# Patient Record
Sex: Male | Born: 1935 | Race: White | Hispanic: No | Marital: Married
Health system: Southern US, Community
[De-identification: ages and names within clinical notes are randomized; demographics above are authoritative.]

---

## 2007-10-18 ENCOUNTER — Encounter: Admission: RE | Admit: 2007-10-18 | Discharge: 2007-10-18 | Payer: Self-pay | Admitting: Orthopedic Surgery

## 2007-10-30 ENCOUNTER — Encounter: Admission: RE | Admit: 2007-10-30 | Discharge: 2007-10-30 | Payer: Self-pay | Admitting: Orthopedic Surgery

## 2008-08-06 ENCOUNTER — Encounter: Admission: RE | Admit: 2008-08-06 | Discharge: 2008-08-06 | Payer: Self-pay | Admitting: Orthopedic Surgery

## 2010-03-08 ENCOUNTER — Encounter: Payer: Self-pay | Admitting: Orthopedic Surgery

## 2012-04-29 ENCOUNTER — Inpatient Hospital Stay: Payer: Self-pay | Admitting: Student

## 2012-04-29 LAB — URINALYSIS, COMPLETE
Bacteria: NONE SEEN
Bilirubin,UR: NEGATIVE
Blood: NEGATIVE
Glucose,UR: NEGATIVE mg/dL (ref 0–75)
Hyaline Cast: 3
Ketone: NEGATIVE
Leukocyte Esterase: NEGATIVE
Nitrite: NEGATIVE
Ph: 9 (ref 4.5–8.0)
Protein: NEGATIVE
RBC,UR: 1 /HPF (ref 0–5)
Specific Gravity: 1.011 (ref 1.003–1.030)
Squamous Epithelial: NONE SEEN
WBC UR: 1 /HPF (ref 0–5)

## 2012-04-29 LAB — COMPREHENSIVE METABOLIC PANEL WITH GFR
Albumin: 3.5 g/dL (ref 3.4–5.0)
Alkaline Phosphatase: 55 U/L (ref 50–136)
Anion Gap: 6 — ABNORMAL LOW (ref 7–16)
BUN: 24 mg/dL — ABNORMAL HIGH (ref 7–18)
Bilirubin,Total: 0.6 mg/dL (ref 0.2–1.0)
Calcium, Total: 8.3 mg/dL — ABNORMAL LOW (ref 8.5–10.1)
Chloride: 100 mmol/L (ref 98–107)
Co2: 30 mmol/L (ref 21–32)
Creatinine: 1.58 mg/dL — ABNORMAL HIGH (ref 0.60–1.30)
EGFR (African American): 43 — ABNORMAL LOW
EGFR (Non-African Amer.): 37 — ABNORMAL LOW
Glucose: 98 mg/dL (ref 65–99)
Osmolality: 276 (ref 275–301)
Potassium: 3.1 mmol/L — ABNORMAL LOW (ref 3.5–5.1)
SGOT(AST): 20 U/L (ref 15–37)
SGPT (ALT): 18 U/L (ref 12–78)
Sodium: 136 mmol/L (ref 136–145)
Total Protein: 7 g/dL (ref 6.4–8.2)

## 2012-04-29 LAB — CBC
MCH: 34 pg (ref 26.0–34.0)
MCHC: 34.2 g/dL (ref 32.0–36.0)
MCV: 99 fL (ref 80–100)
RBC: 3.91 10*6/uL — ABNORMAL LOW (ref 4.40–5.90)

## 2012-04-29 LAB — MAGNESIUM: Magnesium: 1.9 mg/dL

## 2012-04-29 LAB — CK TOTAL AND CKMB (NOT AT ARMC)
CK, Total: 73 U/L (ref 35–232)
CK-MB: 0.7 ng/mL (ref 0.5–3.6)

## 2012-04-29 LAB — TROPONIN I: Troponin-I: <0.02 ng/mL

## 2012-04-30 LAB — LIPID PANEL
Triglycerides: 176 mg/dL (ref 0–200)
VLDL Cholesterol, Calc: 35 mg/dL (ref 5–40)

## 2012-05-01 LAB — BASIC METABOLIC PANEL
Anion Gap: 8 (ref 7–16)
BUN: 26 mg/dL — ABNORMAL HIGH (ref 7–18)
Calcium, Total: 8.7 mg/dL (ref 8.5–10.1)
Chloride: 103 mmol/L (ref 98–107)
Co2: 28 mmol/L (ref 21–32)
Creatinine: 1.34 mg/dL — ABNORMAL HIGH (ref 0.60–1.30)
EGFR (African American): 52 — ABNORMAL LOW
EGFR (Non-African Amer.): 45 — ABNORMAL LOW
Glucose: 92 mg/dL (ref 65–99)
Osmolality: 282 (ref 275–301)
Potassium: 3.3 mmol/L — ABNORMAL LOW (ref 3.5–5.1)
Sodium: 139 mmol/L (ref 136–145)

## 2012-05-03 DIAGNOSIS — I10 Essential (primary) hypertension: Secondary | ICD-10-CM

## 2012-05-03 DIAGNOSIS — I6789 Other cerebrovascular disease: Secondary | ICD-10-CM

## 2012-05-03 DIAGNOSIS — F0150 Vascular dementia without behavioral disturbance: Secondary | ICD-10-CM

## 2012-05-03 DIAGNOSIS — I4891 Unspecified atrial fibrillation: Secondary | ICD-10-CM

## 2012-05-14 DIAGNOSIS — B308 Other viral conjunctivitis: Secondary | ICD-10-CM

## 2012-07-18 ENCOUNTER — Ambulatory Visit: Payer: Self-pay | Admitting: Internal Medicine

## 2013-08-10 IMAGING — US US CAROTID DUPLEX BILAT
1 series · 14 of 24 positions shown · non-contrast
Comparison: none

REASON FOR EXAM: tia. eval for stenosis
COMMENTS:

PROCEDURE:     US  - US CAROTID DOPPLER BILATERAL  - April 29, 2012 [DATE]
RESULT:     Comparison: None
TECHNIQUE: Gray-scale, color Doppler, and spectral Doppler images were
obtained of the extracranial carotid artery systems and vertebral arteries
in the neck.

[Series 1: us carotid duplex bilat · 14 of 60 slices shown]
[im 1/60]
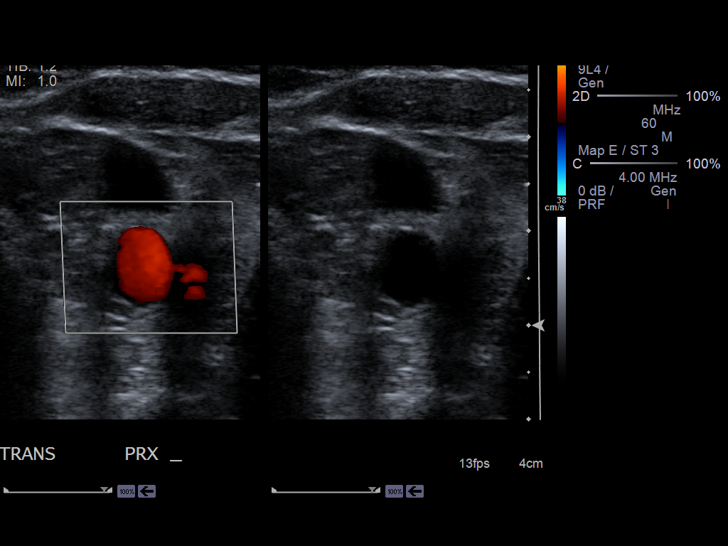
[im 6/60]
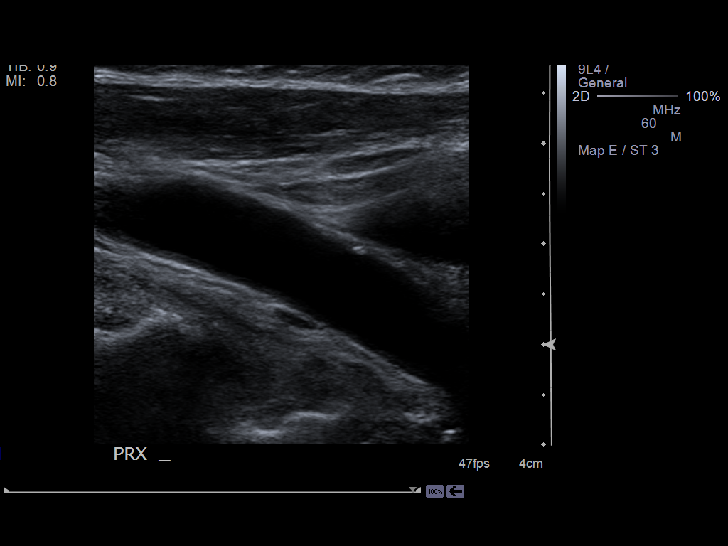
[im 11/60]
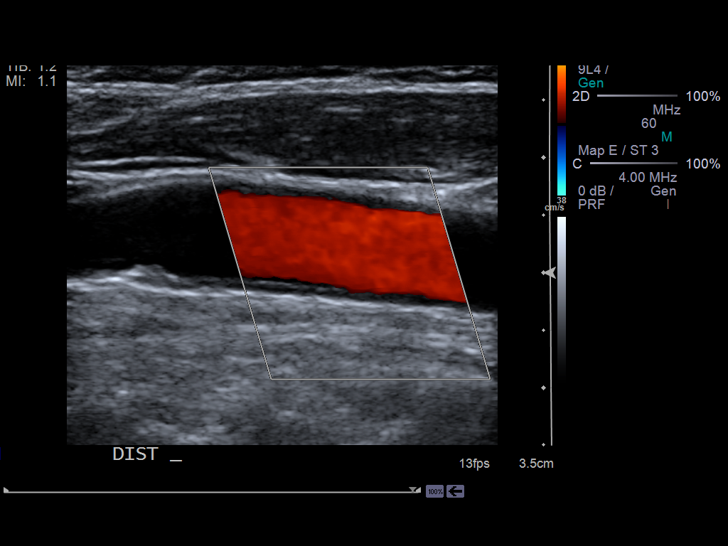
[im 16/60]
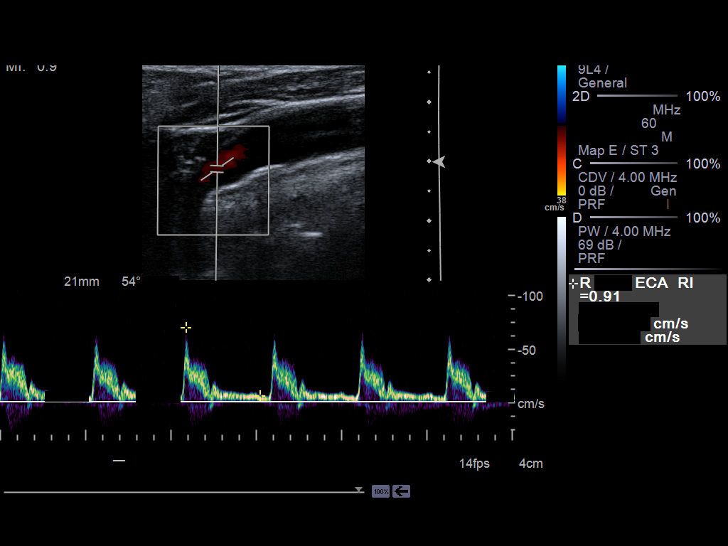
[im 18/60]
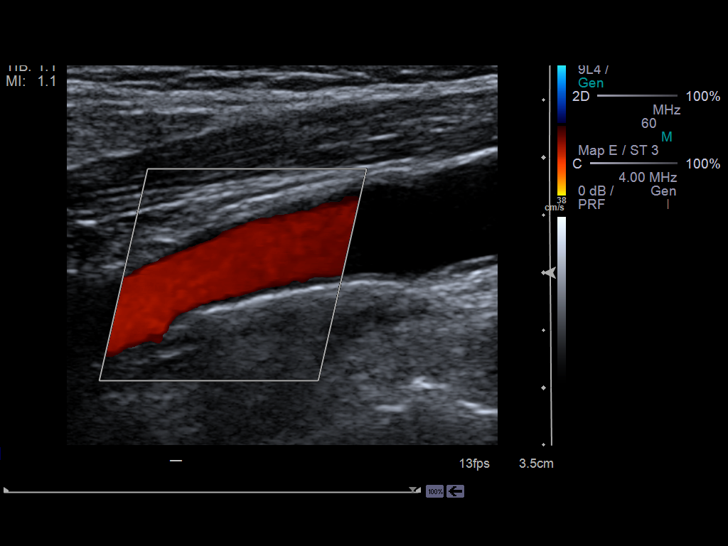
[im 24/60]
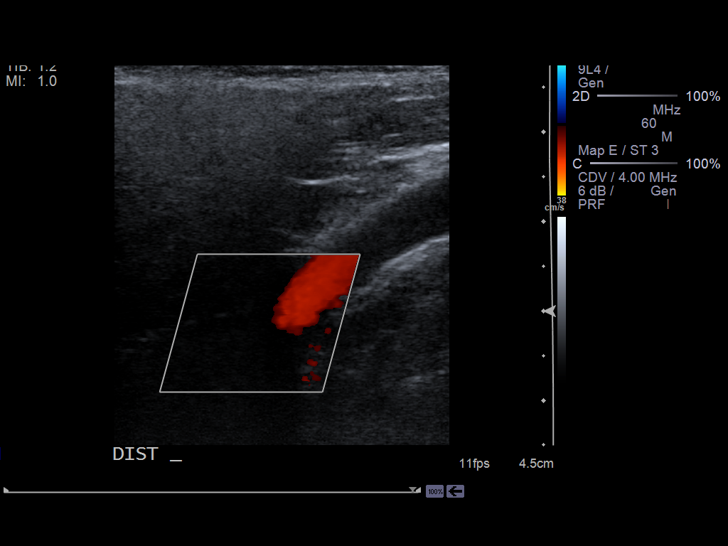
[im 29/60]
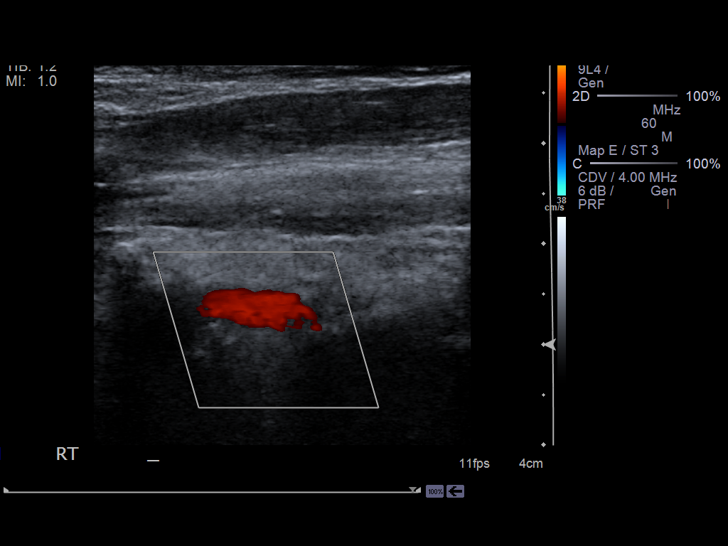
[im 31/60]
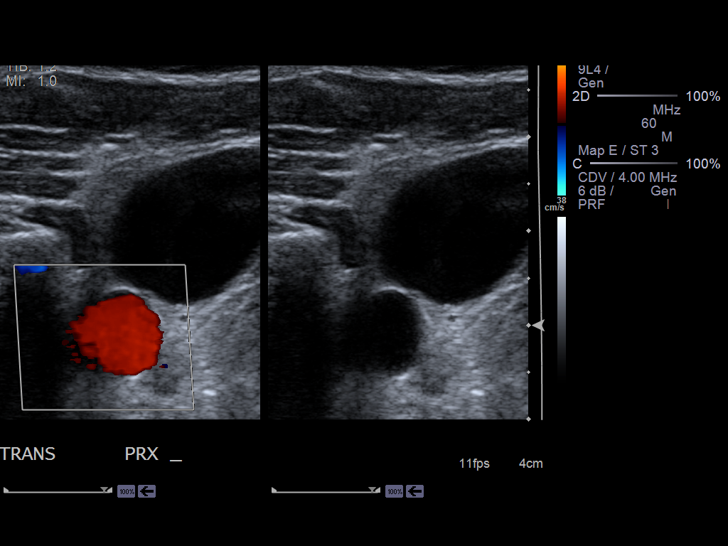
[im 36/60]
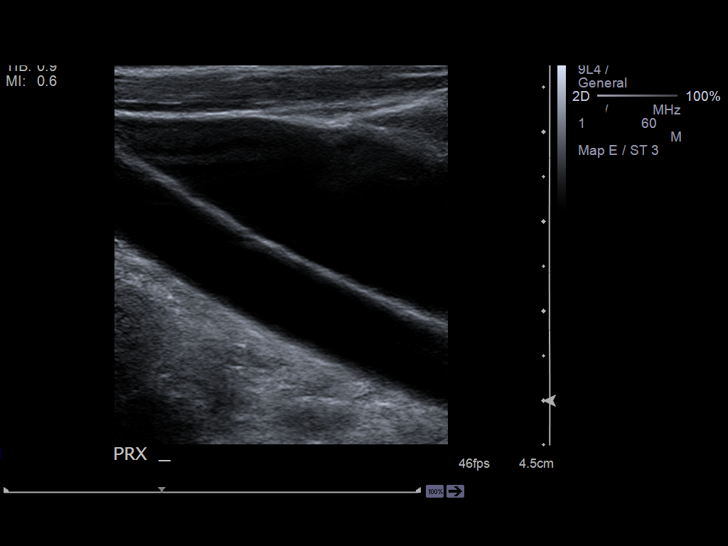
[im 42/60]
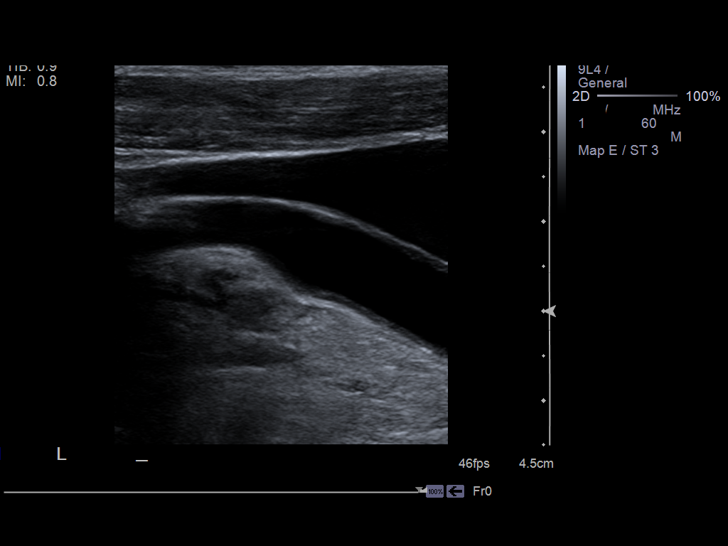
[im 47/60]
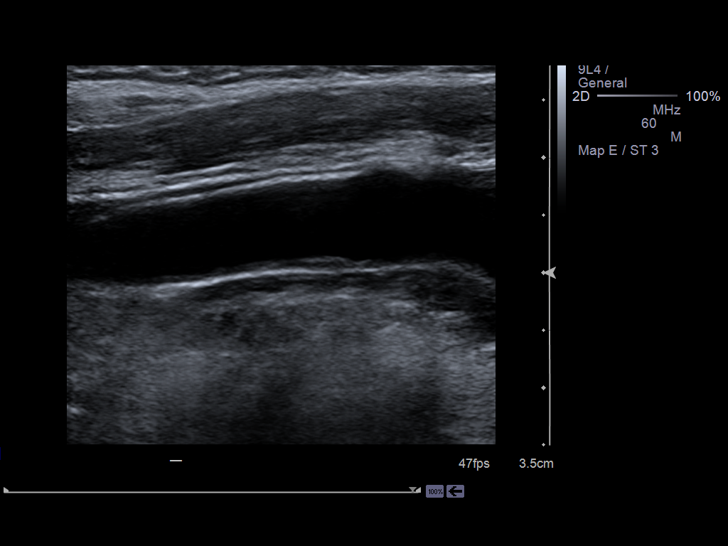
[im 49/60]
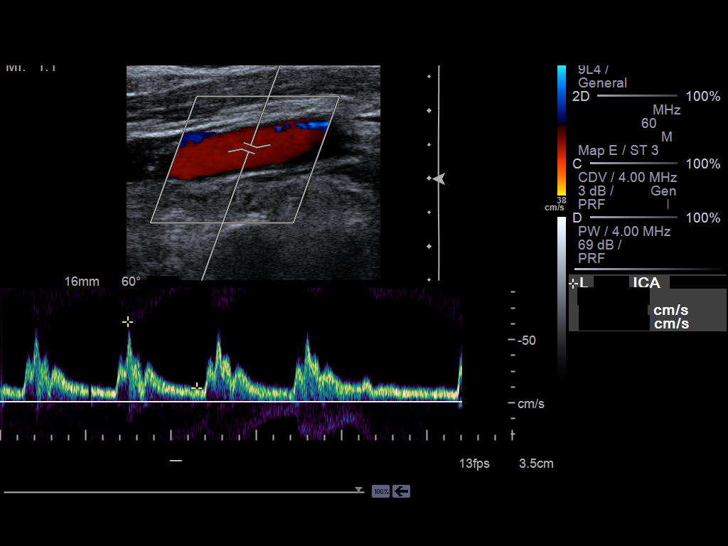
[im 54/60]
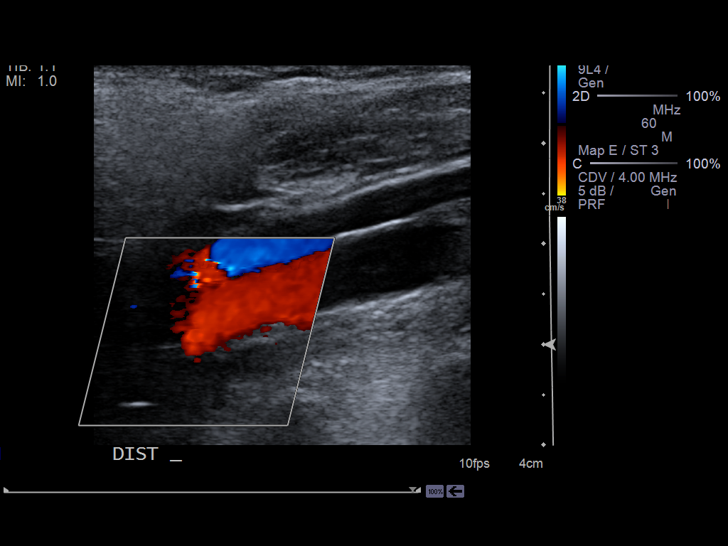
[im 60/60]
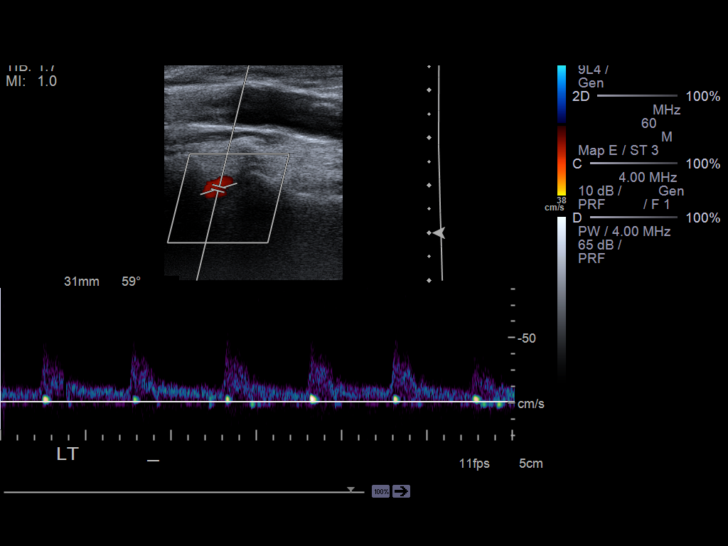

[14 of 24 positions shown; findings below may reference images not displayed]

FINDINGS: No significant atherosclerotic plaques identified in the extracranial
carotid arteries.  The peak systolic velocities are not elevated.

The vertebral arteries are patent bilaterally.
IMPRESSION: No evidence of hemodynamically significant stenosis in the extracranial
carotid arteries.

[REDACTED]

## 2013-08-10 IMAGING — CT CT HEAD WITHOUT CONTRAST
1 series · 16 of 30 positions shown, 20 images · non-contrast
Comparison: none

REASON FOR EXAM: altered mental status
COMMENTS:   May transport without cardiac monitor

PROCEDURE:     CT  - CT HEAD WITHOUT CONTRAST  - April 29, 2012  [DATE]
RESULT:     Comparison:  None
TECHNIQUE: Multiple axial images from the foramen magnum to the vertex were
obtained without IV contrast.

[Series 2: soft tissue · axial · 0.45mm/px · z∈[-167,-32]mm · 16 of 31 slices shown, 20 images]
[im 2/31  brain]
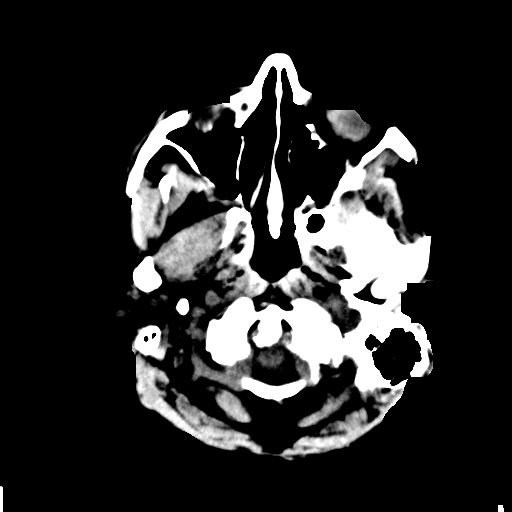
[im 2/31  bone]
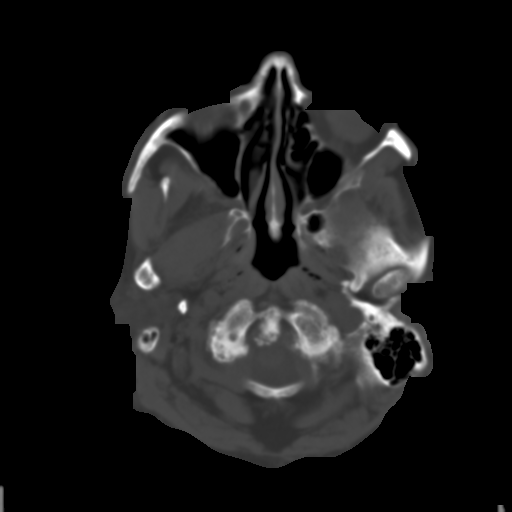
[im 4/31  brain]
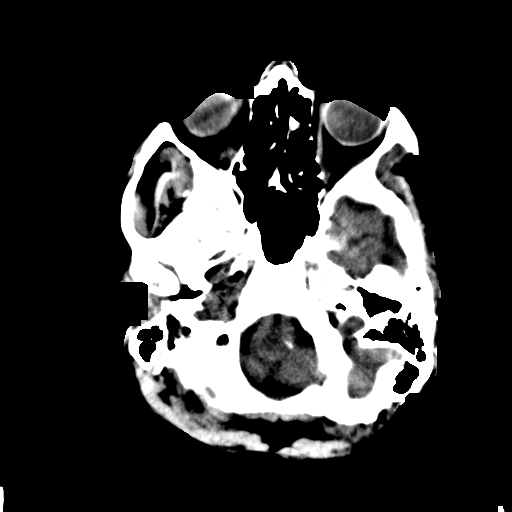
[im 6/31  brain]
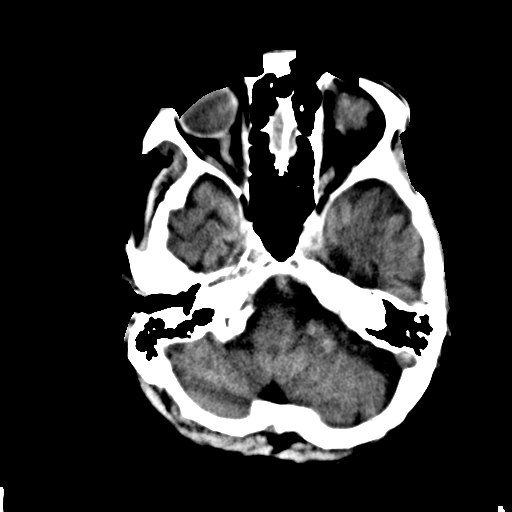
[im 8/31  brain]
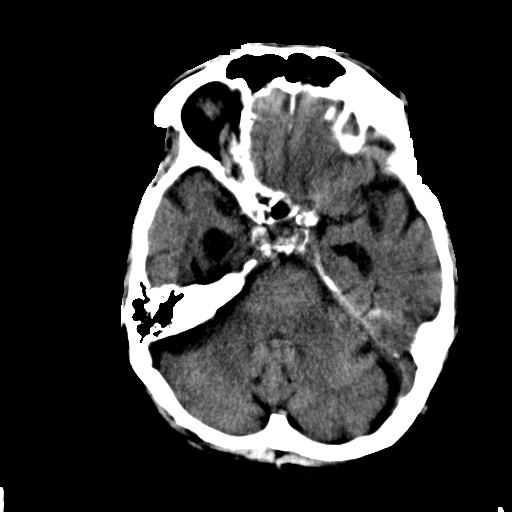
[im 9/31  brain]
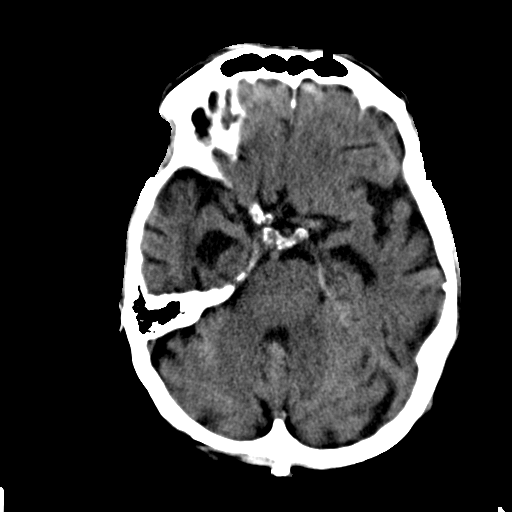
[im 9/31  bone]
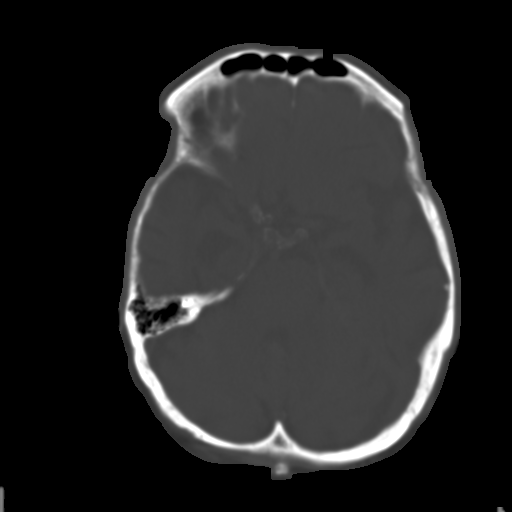
[im 11/31  brain]
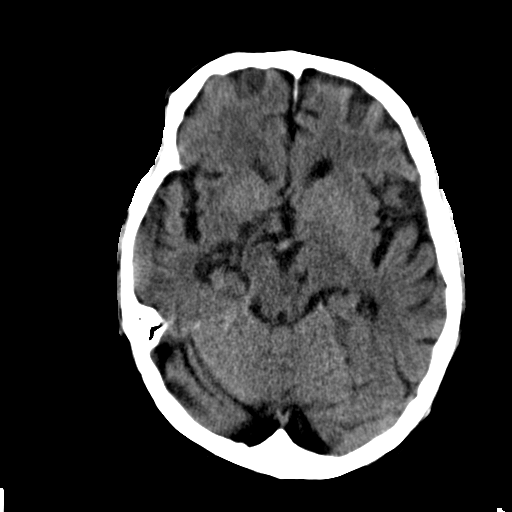
[im 13/31  brain]
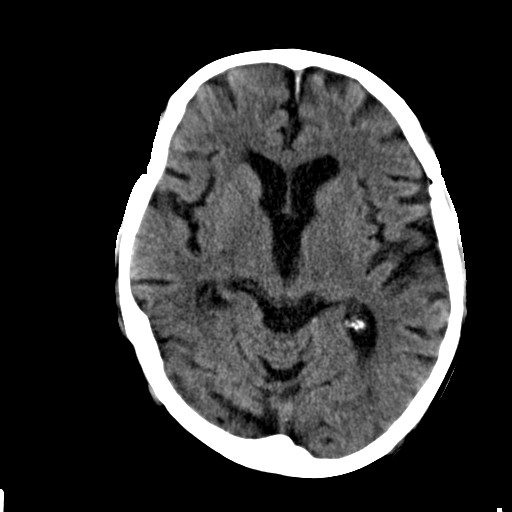
[im 15/31  brain]
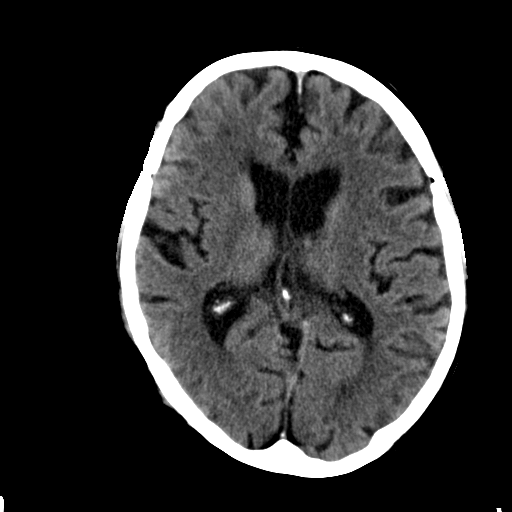
[im 16/31  brain]
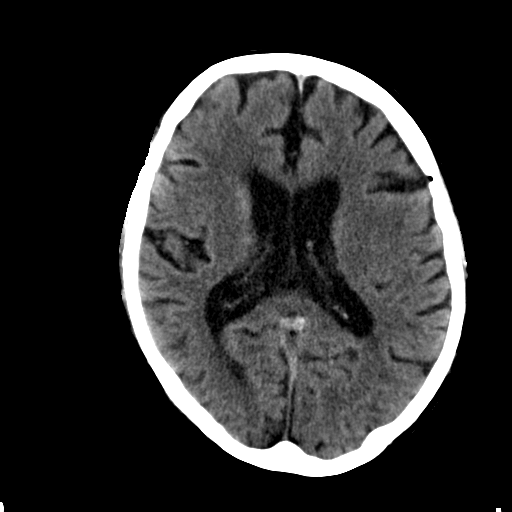
[im 16/31  bone]
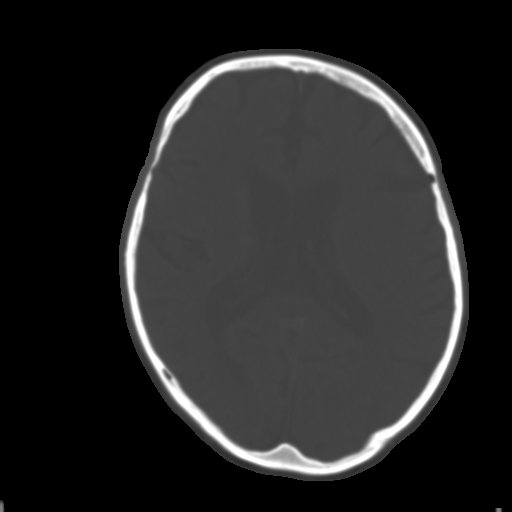
[im 18/31  brain]
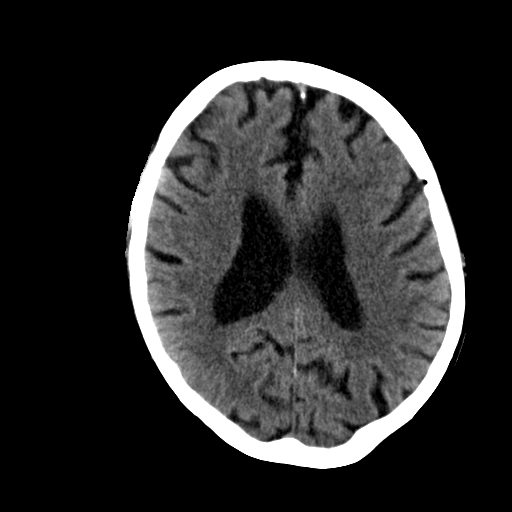
[im 20/31  brain]
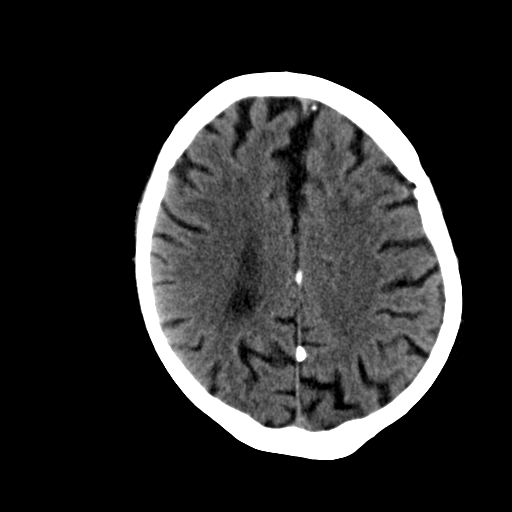
[im 22/31  brain]
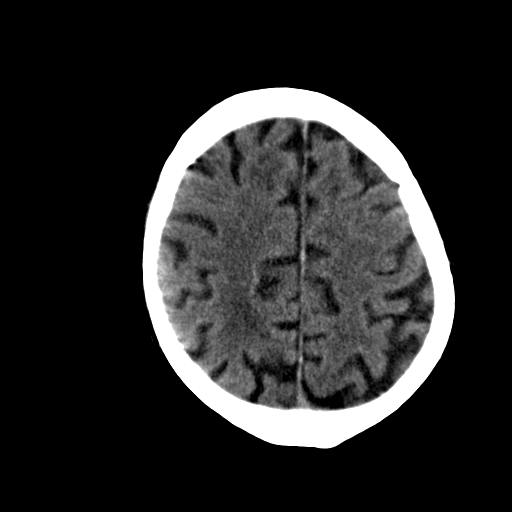
[im 23/31  brain]
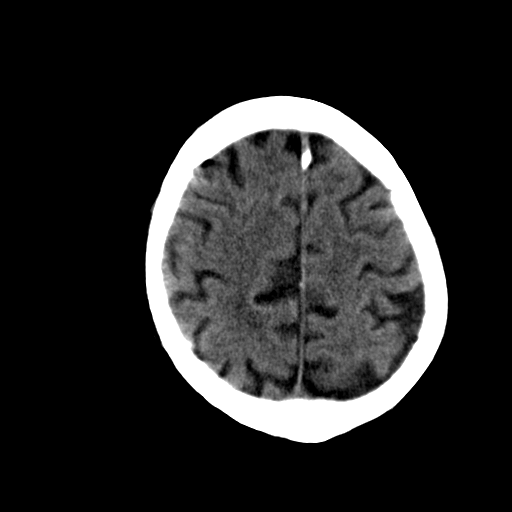
[im 23/31  bone]
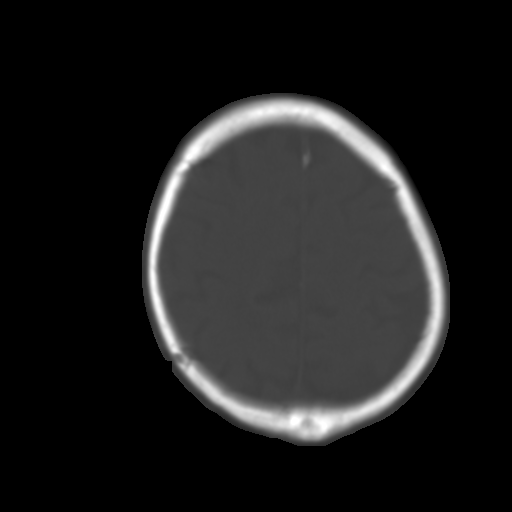
[im 25/31  brain]
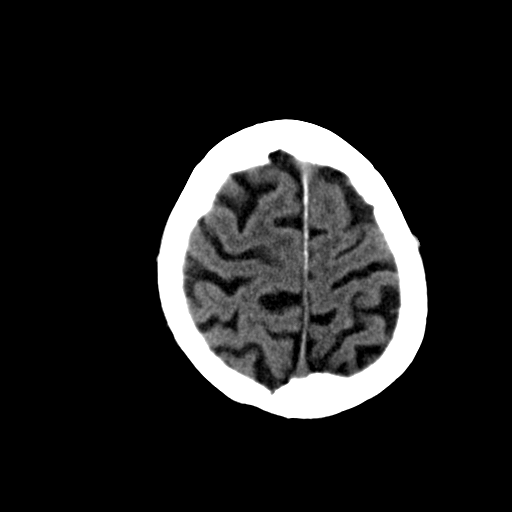
[im 27/31  brain]
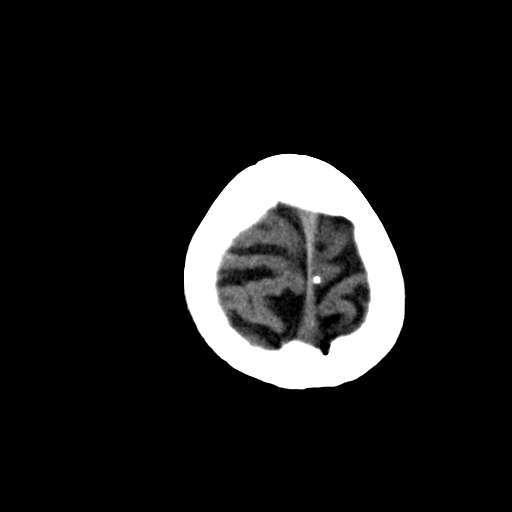
[im 29/31  brain]
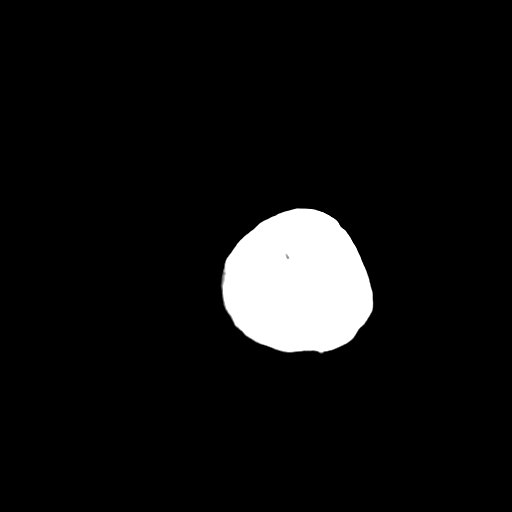

[16 of 30 positions shown; findings below may reference images not displayed]

FINDINGS: There is no evidence for mass effect, midline shift, or extra-axial fluid
collections. There is no evidence for space-occupying lesion, intracranial
hemorrhage, or cortical-based area of infarction. Mild patchy
periventricular hypoattenuation is likely sequela of chronic
microangiopathy. There is mild cerebral atrophy which is age-appropriate.
There is subtle patient motion artifact. There is a small lacunar infarct in
the left thalamus which is age-indeterminate, but likely chronic.

The osseous structures are unremarkable.
IMPRESSION: 1. No intracranial hemorrhage.
2. Small lacunar infarct in the left thalamus is age indeterminate, but
likely chronic. Further evaluation could provided with MRI, as indicated.

## 2013-12-05 DIAGNOSIS — K219 Gastro-esophageal reflux disease without esophagitis: Secondary | ICD-10-CM

## 2013-12-05 DIAGNOSIS — M069 Rheumatoid arthritis, unspecified: Secondary | ICD-10-CM

## 2013-12-05 DIAGNOSIS — I480 Paroxysmal atrial fibrillation: Secondary | ICD-10-CM

## 2013-12-05 DIAGNOSIS — F0150 Vascular dementia without behavioral disturbance: Secondary | ICD-10-CM

## 2013-12-05 DIAGNOSIS — I10 Essential (primary) hypertension: Secondary | ICD-10-CM

## 2013-12-25 DIAGNOSIS — I69398 Other sequelae of cerebral infarction: Secondary | ICD-10-CM

## 2013-12-25 DIAGNOSIS — F0151 Vascular dementia with behavioral disturbance: Secondary | ICD-10-CM

## 2013-12-25 DIAGNOSIS — K219 Gastro-esophageal reflux disease without esophagitis: Secondary | ICD-10-CM

## 2013-12-25 DIAGNOSIS — I10 Essential (primary) hypertension: Secondary | ICD-10-CM

## 2013-12-25 DIAGNOSIS — I4891 Unspecified atrial fibrillation: Secondary | ICD-10-CM

## 2013-12-25 DIAGNOSIS — M130 Polyarthritis, unspecified: Secondary | ICD-10-CM

## 2014-01-23 DIAGNOSIS — I480 Paroxysmal atrial fibrillation: Secondary | ICD-10-CM

## 2014-01-23 DIAGNOSIS — E785 Hyperlipidemia, unspecified: Secondary | ICD-10-CM

## 2014-01-23 DIAGNOSIS — F0150 Vascular dementia without behavioral disturbance: Secondary | ICD-10-CM

## 2014-01-23 DIAGNOSIS — K219 Gastro-esophageal reflux disease without esophagitis: Secondary | ICD-10-CM

## 2014-01-23 DIAGNOSIS — I10 Essential (primary) hypertension: Secondary | ICD-10-CM

## 2014-03-12 DIAGNOSIS — K219 Gastro-esophageal reflux disease without esophagitis: Secondary | ICD-10-CM

## 2014-03-12 DIAGNOSIS — I10 Essential (primary) hypertension: Secondary | ICD-10-CM

## 2014-03-12 DIAGNOSIS — E785 Hyperlipidemia, unspecified: Secondary | ICD-10-CM

## 2014-03-12 DIAGNOSIS — M064 Inflammatory polyarthropathy: Secondary | ICD-10-CM

## 2014-03-12 DIAGNOSIS — I4891 Unspecified atrial fibrillation: Secondary | ICD-10-CM

## 2014-03-12 DIAGNOSIS — F0150 Vascular dementia without behavioral disturbance: Secondary | ICD-10-CM

## 2014-04-17 DIAGNOSIS — F0150 Vascular dementia without behavioral disturbance: Secondary | ICD-10-CM

## 2014-04-17 DIAGNOSIS — I10 Essential (primary) hypertension: Secondary | ICD-10-CM | POA: Diagnosis not present

## 2014-04-17 DIAGNOSIS — I480 Paroxysmal atrial fibrillation: Secondary | ICD-10-CM | POA: Diagnosis not present

## 2014-04-17 DIAGNOSIS — K219 Gastro-esophageal reflux disease without esophagitis: Secondary | ICD-10-CM | POA: Diagnosis not present

## 2014-04-17 DIAGNOSIS — E785 Hyperlipidemia, unspecified: Secondary | ICD-10-CM | POA: Diagnosis not present

## 2014-06-06 NOTE — H&P (Signed)
PATIENT NAME:  Adam Salas, MAIORINO MR#:  [id number] DATE OF BIRTH:  01/11/1917  DATE OF ADMISSION:  04/29/2012  REFERRING PHYSICIAN: Dr. Jasmine December    PRIMARY CARE PHYSICIAN: Dr.  Lovie Macadamia   CHIEF COMPLAINT: Unable to talk.     HISTORY OF PRESENT ILLNESS: The patient is a pleasant 81 year old Caucasian male who lives independent living facility at [facility] with history of atrial fibrillation and hypertension who was here with his wife. Apparently this morning he was eating an apple about 7:30 and the wife discovered the patient's acute onset inability to verbalize speech. Furthermore, he was a little bit confused. Symptoms lasted about 30 minutes or so. EMS was called and was found with bradycardia, heart rate is in the 40s and was given 0.5 of atropine. Heart rate went to 60s. Per wife, the speech deficits are resolved. The patient has no further aphasia; however, on interviewing does have occasional ward finding disability, which per wife has been going on for the past year and a 1 to 2 years, although he has no diagnosis of dementia. EKG shows sinus rhythm. CT scan shows small lacunar infarct in the left thalamus, age indeterminate. Hospitalist services were contacted for further evaluation and management.   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   PAST SURGICAL HISTORY: Tonsillectomy, appendectomy, left hip replacement, hernia repair, cataract surgery.   FAMILY HISTORY: Hypertension.   SOCIAL HISTORY: No tobacco, drinks one beer a day, no other drug use. Lives in independent living facility at [facility]   ALLERGIES: PENICILLIN.   OUTPATIENT MEDICATIONS: Hydrochlorothiazide 12.5 mg daily, Lanoxin 125 mcg daily, Motrin 200 mg every 4 hours as needed for pain, Norvasc 5 mg daily, Prilosec 20 mg extended-release daily, propranolol 60 mg extended-release 1 tab once a day.    REVIEW OF SYSTEMS: CONSTITUTIONAL: No fever or fatigue, perhaps a couple of pounds of weight loss, which patient attributes to walking and being active.  EYES:  No blurry vision or double vision.  ENT: Some chronic hearing loss.  RESPIRATORY: No cough, wheezing. History of remote TB and pneumothorax.  CARDIOVASCULAR: No chest pain. Occasional palpitations. No swelling in the legs. History of atrial fibrillation and hypertension.  GASTROINTESTINAL: No nausea, vomiting, diarrhea, abdominal pain, rectal bleeding or dark stools or bloody stools.  GENITOURINARY: Denies dysuria or hematuria.  HEMATOLOGIC/LYMPHATIC: Anemia or easy bruising.  SKIN: No rashes.  MUSCULOSKELETAL: Has arthritis.  NEUROLOGIC: Denies numbness or weakness. No history of stroke.  PSYCHIATRIC: Denies anxiety or insomnia. Possible forgetfulness in the last year to 2 years.   PHYSICAL EXAMINATION: VITAL SIGNS: Temperature 98 on arrival, pulse 62, respiratory rate 18, blood pressure 150/69 and oxygen saturation 98% on room air.  GENERAL: The patient pleasant, elderly male with sitting in bed in no obvious distress, talking in full sentences.  HEENT: Normocephalic, atraumatic. Pupils are equal and reactive. Anicteric sclerae. Extraocular muscles intact. Moist mucous membranes.  NECK: Supple. No thyroid tenderness or cervical lymphadenopathy. No JVD.  CARDIOVASCULAR: S1 and S2 irregularly irregular. No significant murmurs or rubs appreciated.  LUNGS: Clear to auscultation without wheezing or rhonchi.  ABDOMEN: Soft, nontender, nondistended. Positive bowel sounds in all quadrants. No organomegaly appreciated.  EXTREMITIES: No significant lower extremity edema.  SKIN: No obvious rashes. There is some chronic discoloration of the lower extremities without any evidence of cellulitis.  NEUROLGOLIC:  Cranial nerves II through XII appear to be grossly  intact. Strength is 5/5 in all extremities. Sensation is intact to light touch  Romberg negative. Rapid alternating movements in finger-to-nose intact.  PSYCHIATRIC: Awake, alert, oriented x2, not oriented to  time. Cooperative, conversant.   LABORATORY, DIAGNOSTIC, AND RADIOLOGICAL DATA: CT of the head as above.   Chest x-ray, 1 view, showing dense opacities overlying the mid upper and left lung, may represent calcified pleural plaques.   EKG: Sinus rhythm with PVC. No acute ST elevations or depressions.   Glucose 98, BUN 24, creatinine 1.58, potassium 3.1. LFTs within normal limits. Magnesium 1.1. Troponin negative. CK-MB and CK total within normal limits. WBC of 6.9, hemoglobin 13.3, platelets 348. Urinalysis not suggestive of infection. No recent blood work otherwise compare to.   ASSESSMENT AND PLAN:  1.  We have a 81 year old pleasant Caucasian male with a history of hypertension, atrial fibrillation and remote history of tuberculosis with acute onset what appears to be expressive aphasia lasting about 30 minutes and resolved now, but negative CT of the head for a stroke with neurologically being intact currently. At this point, we will admit the patient to the hospital. The patient  possibly had a transient ischemic attack, but I cannot rule out a stroke. We would obtain an MRI of the brain, an echocardiogram as well as an ultrasound of carotids to look for any significant arrhythmias. Do frequent neurological checks and check a lipid profile. We would follow with the bradycardia and hold the propranolol at this point. Although he has a history of atrial fibrillation, he appears to be sinus currently. We will obtain a cardiology consult to see if the patient possibly has a sick sinus syndrome. He is not on any anticoagulation. He does not have a history of any significant falls. We would monitor him on telemetry and see if he has recurrent atrial fibrillation or sick sinus syndrome. We would start him on a full dose aspirin for now, continue the digoxin and hold the  propranolol given the bradycardia this morning.  2.  As for as the hypertension, would bump up amlodipine as we are holding the hydrochlorothiazide and a beta blocker.  3.  HCTZ secondary to renal failure and hypokalemia. We do not have a previous recent BMP and it is possible that his creatinine was baseline, which puts him in chronic kidney failure stage III, but we have to monitor the kidney function. I will start him on some gentle IV fluids for now and replete to hypokalemia. We would start him on a statin as well. Would continue with Prilosec and start him on heparin for deep vein thrombosis prophylaxis.   CODE STATUS: The patient is full code.   TOTAL TIME SPENT: 60 minutes.    ____________________________ Vivien Presto, MD sa:cc D: 04/29/2012 11:50:54 ET T: 04/29/2012 16:04:17 ET JOB#: [id number]  cc: Vivien Presto, MD, <Dictator> Youlanda Roys. Lovie Macadamia, MD Vivien Presto MD ELECTRONICALLY SIGNED 05/08/2012 13:28

## 2014-06-06 NOTE — Consult Note (Signed)
General Aspect 81 yo male followed by Dr. Lovie Macadamia with history of atrial fibrillation, palpitations, hypertension admitted after being noted to be somewhat aphasic by his wife. EMS was called and he was noted to be bradycardic with heart rates in the 40s. He was given atropine with improvement in his heart rate and per report, his aphasia improved. He has ruled out for an mi and head ct was unremarkaable for signficant acute abnormality. He had a lacunar thalamic infarct. His carotid doppler did not reveal any signficant disease. He has ruled out for an mi and ekg revaled sins rhythm on admission .  Telemetry has revealed sr with intermitant pacs and probable brief afib. He has been on digoxin and propranolol. He remains somewhat confused this am. His son states that there has been a gradual decline in his memory over the past several months. His son states he became more confused last pm during the night.   Physical Exam:  GEN well developed, well nourished, no acute distress   HEENT PERRL, hearing intact to voice   NECK supple  No masses   RESP clear BS  no use of accessory muscles   CARD Regular rate and rhythm  Murmur   Murmur Systolic   Systolic Murmur Out flow   ABD denies tenderness  normal BS   LYMPH negative neck, negative axillae   EXTR negative cyanosis/clubbing, negative edema   SKIN normal to palpation   NEURO cranial nerves intact, motor/sensory function intact   PSYCH poor insight, somewhat confused and disoriented   Review of Systems:  Subjective/Chief Complaint dysarthria and confusion   General: Fatigue  Weakness   Skin: No Complaints   ENT: No Complaints   Eyes: No Complaints   Neck: No Complaints   Respiratory: No Complaints   Cardiovascular: Palpitations   Gastrointestinal: No Complaints   Genitourinary: No Complaints   Vascular: No Complaints   Musculoskeletal: No Complaints   Neurologic: sydarthria and confusion   Hematologic: No  Complaints   Endocrine: No Complaints   Psychiatric: No Complaints   Review of Systems: All other systems were reviewed and found to be negative   Medications/Allergies Reviewed Medications/Allergies reviewed     Atrial Fibrillation:    HTN:    Cataract Extraction:    hernia repair:    hip replacement:    Appendectomy:   Home Medications: Medication Instructions Status  propranolol 60 mg oral capsule, extended release 1 cap(s) orally once a day Active  Prilosec 20 mg oral delayed release capsule 1 cap(s) orally once a day Active  Lanoxin 125 mcg (0.125 mg) oral tablet 1 tab(s) orally once a day Active  hydrochlorothiazide 12.5 mg oral capsule 1 cap(s) orally once a day Active  Norvasc 5 mg oral tablet 1 tab(s) orally once a day Active  Motrin 200 mg oral tablet 1 tab(s) orally every 4 hours, As Needed- for Pain  Active   EKG:  Abnormal NSSTTW changes   Interpretation sinsu bradycardia with intermitant afib    Penicillin: Unknown   Impression 81 yo male with history of afib and hypertension admitted after being noted to being somewhat aphasic by his wife. He has been on propranolol apparently for hypertnsion and palpitations/afib as well as digoxin. He was bradycardic when ems arrived and he recieved atropine with improvement. He has ruled out for mi. Head ct unremarkable for acute stroke. MRI of brain pending. Carotid dopplers unremarkable. Echo pending. CHADSS score was 2 and is 4 at present given neurologic  event. No prior history of falls. Bradycardia may have played a role in his symtpoms and has been appropriately discontinued. Digoxin may also need to be held. He is hemodynaically stable. His sono states he has had gradually declining memory recently and this may be an acute exacerbation of this. Given his chadss score, he will need to be evaluiated prior to discharge for chronic anticoagulation pending neurologic workup. He has no absolute contraindications at present.  Will review echo when available and follow heart rate.   Plan 1. remain off of propranolol and hold digoxin 2. Echo  3. Complete neurologic workup to include mri 4. Conisider anticoagulation chronically prior to discharge.  5 Further recs pending course.   Electronic Signatures: Teodoro Spray (MD)  (Signed 17-Mar-14 08:21)  Authored: General Aspect/Present Illness, History and Physical Exam, Review of System, Past Medical History, Home Medications, EKG , Allergies, Impression/Plan   Last Updated: 17-Mar-14 08:21 by Teodoro Spray (MD)

## 2014-06-06 NOTE — Discharge Summary (Signed)
PATIENT NAME:  Adam Salas, Adam Salas MR#:  [id number] DATE OF BIRTH:  1916-12-31  DATE OF ADMISSION:  04/29/2012 DATE OF DISCHARGE:  05/01/2012   CONSULTANTS: Dr. Ubaldo Glassing from cardiology.   CHIEF COMPLAINT: Unable to talk.   PRIMARY CARE PHYSICIAN: Juluis Pitch, MD  CARDIOLOGIST:  Bartholome Bill, MD from [facility].  DISCHARGE DIAGNOSES: 1.  Aphasia, likely transient ischemic attack.  2.  Bradycardia, possibly medication induced.  3.  History of atrial fibrillation now in sinus.  4.  Delirium likely under diagnosed to dementia. 5.  Hypokalemia.  6.  Likely underlying chronic kidney disease, stage III.  7.  Hypertension.   DISCHARGE MEDICATIONS: 1.  Metoprolol 25 mg every 12 hours.  2.  Prilosec 20 mg daily.  3.  Atorvastatin 20 mg daily.  4.  Aspirin 325 mg 1 tab daily.  5.  Amlodipine 10 mg once a day.   DIET: Low sodium.   ACTIVITY: As tolerated.   FOLLOWUP: Please follow with PCP and cardiologist within 1 to 2 weeks. Please no further driving.  The patient is a full code.   SIGNIFICANT LABORATORY AND DIAGNOSTIC DATA:  Initial creatinine 1.58. Last creatinine 1.34. Initial sodium 3.1, triglycerides 176, HDL 31 and LDL 68. LFTs on arrival within normal limits. CK-MB and troponin within normal limits. WBC 6.9, hemoglobin 13.3, platelets of 248. Urinalysis not suggestive of infection. Echocardiogram showed ejection fraction of about 55% to 60%, with impaired relaxation pattern for diastolic filling. CT of the head on arrival showed no intracranial hemorrhage, small lacunar infarct in the left thalamus, age indeterminate.  MRI of the brain without contrast showed no acute intracranial pathology. A faint area of hyperintensity in the left inferior frontal lobe, could be artifact versus a true cortical based lesion. Chest x-ray, portable 1 view, showing opacity overlying the mid upper left lung may represent calcified pleural plaques.  HISTORY OF PRESENT ILLNESS AND HOSPITAL  COURSE:  For full details of history and physical, please see the dictation on 03/16 by Dr. Bridgette Habermann,, but briefly this is a pleasant 81 year old Caucasian male with a history of hypertension, atrial fibrillation, GERD, history of tuberculosis multiple decades ago with a resultant pneumothorax. He was brought in after the wife experienced the patient some expressive aphasia. EMS was called, he was noted to have some bradycardia and 0.5 of atropine was given. He was admitted to the hospitalist service with CT of the head negative and neurologically intact with aphagia improved by the time he was in the ER.   Further work-up was done, including ultrasound of the carotids, which was negative for stenosis as well as echocardiogram and MRI. MRI did not suggest a stroke. He was started on a full dose aspirin and also on a statin. Further neurological checks were done and he has been intact in that regard. He appears to have possibly a TIA given the resolution of symptoms. Another possibility, but less likely, is symptomatic bradycardia. He has been on digoxin and propranolol and they were withheld. The patient was seen by cardiology. He did have bouts of tachycardia, short bursts, however, the rate has been adequately controlled and as we are holding digoxin and Tylenol per cardiology, he will be discharged with metoprolol instead. In regards to his atrial fibrillation, he appears to be in sinus. It appears to be paroxysmal. I discussed the case with the patient's son and at this point we elected to start him on a full dose aspirin, given the fall risk and the dementia. The  family is okay with aspirin at this point, full dose and he should follow-up with his cardiologist as an outpatient for up titration of metoprolol as needed. The case was discussed with Dr. Ubaldo Glassing who is agreeable to this plan. He was seen by physical therapy and will be discharged to a rehab.   He is instructed to not to drive any further. He did  have bouts of delirium, likely secondary to sundowning and an underlying dementia. He did have elevation of a creatinine which I think is more chronic. His diuretic has been held, and he did have some hypokalemia and they were repleted. At this point he will be discharged with outpatient follow-up.   Total time spent: 35 minutes.    ____________________________ Vivien Presto, MD sa:ct D: 05/01/2012 14:09:00 ET T: 05/01/2012 14:24:33 ET JOB#: [id number]  cc: Vivien Presto, MD, <Dictator> Youlanda Roys. Lovie Macadamia, MD Javier Docker Ubaldo Glassing, MD Vivien Presto MD ELECTRONICALLY SIGNED 05/08/2012 13:28

## 2014-06-27 DIAGNOSIS — I4891 Unspecified atrial fibrillation: Secondary | ICD-10-CM | POA: Diagnosis not present

## 2014-06-27 DIAGNOSIS — K219 Gastro-esophageal reflux disease without esophagitis: Secondary | ICD-10-CM | POA: Diagnosis not present

## 2014-06-27 DIAGNOSIS — E785 Hyperlipidemia, unspecified: Secondary | ICD-10-CM | POA: Diagnosis not present

## 2014-06-27 DIAGNOSIS — I10 Essential (primary) hypertension: Secondary | ICD-10-CM | POA: Diagnosis not present

## 2014-06-27 DIAGNOSIS — F0150 Vascular dementia without behavioral disturbance: Secondary | ICD-10-CM

## 2014-08-25 DIAGNOSIS — I10 Essential (primary) hypertension: Secondary | ICD-10-CM

## 2014-08-25 DIAGNOSIS — I480 Paroxysmal atrial fibrillation: Secondary | ICD-10-CM

## 2014-08-25 DIAGNOSIS — E785 Hyperlipidemia, unspecified: Secondary | ICD-10-CM

## 2014-08-25 DIAGNOSIS — F0150 Vascular dementia without behavioral disturbance: Secondary | ICD-10-CM

## 2014-10-22 DIAGNOSIS — I4891 Unspecified atrial fibrillation: Secondary | ICD-10-CM | POA: Diagnosis not present

## 2014-10-22 DIAGNOSIS — F0150 Vascular dementia without behavioral disturbance: Secondary | ICD-10-CM | POA: Diagnosis not present

## 2014-10-22 DIAGNOSIS — E785 Hyperlipidemia, unspecified: Secondary | ICD-10-CM | POA: Diagnosis not present

## 2014-10-22 DIAGNOSIS — I10 Essential (primary) hypertension: Secondary | ICD-10-CM | POA: Diagnosis not present

## 2014-12-18 DIAGNOSIS — E785 Hyperlipidemia, unspecified: Secondary | ICD-10-CM | POA: Diagnosis not present

## 2014-12-18 DIAGNOSIS — F0150 Vascular dementia without behavioral disturbance: Secondary | ICD-10-CM | POA: Diagnosis not present

## 2014-12-18 DIAGNOSIS — I10 Essential (primary) hypertension: Secondary | ICD-10-CM | POA: Diagnosis not present

## 2014-12-18 DIAGNOSIS — I480 Paroxysmal atrial fibrillation: Secondary | ICD-10-CM | POA: Diagnosis not present

## 2015-01-14 DIAGNOSIS — B351 Tinea unguium: Secondary | ICD-10-CM | POA: Diagnosis not present

## 2015-02-27 DIAGNOSIS — I10 Essential (primary) hypertension: Secondary | ICD-10-CM | POA: Diagnosis not present

## 2015-02-27 DIAGNOSIS — F0150 Vascular dementia without behavioral disturbance: Secondary | ICD-10-CM | POA: Diagnosis not present

## 2015-02-27 DIAGNOSIS — E785 Hyperlipidemia, unspecified: Secondary | ICD-10-CM | POA: Diagnosis not present

## 2015-02-27 DIAGNOSIS — I4891 Unspecified atrial fibrillation: Secondary | ICD-10-CM | POA: Diagnosis not present

## 2015-04-16 DIAGNOSIS — I10 Essential (primary) hypertension: Secondary | ICD-10-CM | POA: Diagnosis not present

## 2015-04-16 DIAGNOSIS — F0150 Vascular dementia without behavioral disturbance: Secondary | ICD-10-CM | POA: Diagnosis not present

## 2015-04-16 DIAGNOSIS — I480 Paroxysmal atrial fibrillation: Secondary | ICD-10-CM | POA: Diagnosis not present

## 2015-04-16 DIAGNOSIS — E785 Hyperlipidemia, unspecified: Secondary | ICD-10-CM | POA: Diagnosis not present

## 2015-06-24 DIAGNOSIS — I10 Essential (primary) hypertension: Secondary | ICD-10-CM | POA: Diagnosis not present

## 2015-06-24 DIAGNOSIS — I4891 Unspecified atrial fibrillation: Secondary | ICD-10-CM | POA: Diagnosis not present

## 2015-06-24 DIAGNOSIS — E785 Hyperlipidemia, unspecified: Secondary | ICD-10-CM | POA: Diagnosis not present

## 2015-06-24 DIAGNOSIS — F0150 Vascular dementia without behavioral disturbance: Secondary | ICD-10-CM | POA: Diagnosis not present

## 2015-07-29 DIAGNOSIS — B351 Tinea unguium: Secondary | ICD-10-CM | POA: Diagnosis not present

## 2015-08-20 DIAGNOSIS — I480 Paroxysmal atrial fibrillation: Secondary | ICD-10-CM

## 2015-08-20 DIAGNOSIS — I10 Essential (primary) hypertension: Secondary | ICD-10-CM | POA: Diagnosis not present

## 2015-08-20 DIAGNOSIS — F0150 Vascular dementia without behavioral disturbance: Secondary | ICD-10-CM

## 2015-08-20 DIAGNOSIS — K219 Gastro-esophageal reflux disease without esophagitis: Secondary | ICD-10-CM | POA: Diagnosis not present

## 2015-08-20 DIAGNOSIS — E785 Hyperlipidemia, unspecified: Secondary | ICD-10-CM | POA: Diagnosis not present

## 2015-10-30 DIAGNOSIS — F0150 Vascular dementia without behavioral disturbance: Secondary | ICD-10-CM | POA: Diagnosis not present

## 2015-10-30 DIAGNOSIS — I4891 Unspecified atrial fibrillation: Secondary | ICD-10-CM

## 2015-10-30 DIAGNOSIS — K219 Gastro-esophageal reflux disease without esophagitis: Secondary | ICD-10-CM | POA: Diagnosis not present

## 2015-10-30 DIAGNOSIS — E785 Hyperlipidemia, unspecified: Secondary | ICD-10-CM | POA: Diagnosis not present

## 2015-12-31 DIAGNOSIS — K219 Gastro-esophageal reflux disease without esophagitis: Secondary | ICD-10-CM

## 2015-12-31 DIAGNOSIS — I10 Essential (primary) hypertension: Secondary | ICD-10-CM | POA: Diagnosis not present

## 2015-12-31 DIAGNOSIS — E785 Hyperlipidemia, unspecified: Secondary | ICD-10-CM | POA: Diagnosis not present

## 2015-12-31 DIAGNOSIS — I480 Paroxysmal atrial fibrillation: Secondary | ICD-10-CM | POA: Diagnosis not present

## 2015-12-31 DIAGNOSIS — F0150 Vascular dementia without behavioral disturbance: Secondary | ICD-10-CM | POA: Diagnosis not present

## 2016-01-15 DIAGNOSIS — F339 Major depressive disorder, recurrent, unspecified: Secondary | ICD-10-CM | POA: Diagnosis not present

## 2016-01-15 DIAGNOSIS — F0150 Vascular dementia without behavioral disturbance: Secondary | ICD-10-CM

## 2016-01-15 DIAGNOSIS — E039 Hypothyroidism, unspecified: Secondary | ICD-10-CM | POA: Diagnosis not present

## 2016-01-15 DIAGNOSIS — C9110 Chronic lymphocytic leukemia of B-cell type not having achieved remission: Secondary | ICD-10-CM | POA: Diagnosis not present

## 2016-02-24 DIAGNOSIS — E785 Hyperlipidemia, unspecified: Secondary | ICD-10-CM | POA: Diagnosis not present

## 2016-02-24 DIAGNOSIS — I4891 Unspecified atrial fibrillation: Secondary | ICD-10-CM | POA: Diagnosis not present

## 2016-02-24 DIAGNOSIS — I10 Essential (primary) hypertension: Secondary | ICD-10-CM | POA: Diagnosis not present

## 2016-02-24 DIAGNOSIS — K219 Gastro-esophageal reflux disease without esophagitis: Secondary | ICD-10-CM | POA: Diagnosis not present

## 2016-02-24 DIAGNOSIS — F0150 Vascular dementia without behavioral disturbance: Secondary | ICD-10-CM

## 2016-05-02 ENCOUNTER — Telehealth: Payer: Self-pay

## 2016-05-02 NOTE — Telephone Encounter (Signed)
Adam Salas pts daughter (do not see DPR signed) left v/m; pt is [facility] Adam Salas at [facility]. Adam Salas visited pt this past weekend and she was concerned about pt inability to get food down when swallowing. Adam Salas request cb by Dr Silvio Pate.

## 2016-05-02 NOTE — Telephone Encounter (Signed)
Please let her know that I am going to see him tomorrow or Thursday. I did speak to the nurse today---he is already on mechanical soft food and has not been losing weight. They have not seen signs of aspiration. If I think we need to change anything after my evaluation, I will call her then.

## 2016-05-03 DIAGNOSIS — K210 Gastro-esophageal reflux disease with esophagitis: Secondary | ICD-10-CM | POA: Diagnosis not present

## 2016-05-03 DIAGNOSIS — I480 Paroxysmal atrial fibrillation: Secondary | ICD-10-CM | POA: Diagnosis not present

## 2016-05-03 DIAGNOSIS — I10 Essential (primary) hypertension: Secondary | ICD-10-CM | POA: Diagnosis not present

## 2016-05-03 DIAGNOSIS — F0150 Vascular dementia without behavioral disturbance: Secondary | ICD-10-CM | POA: Diagnosis not present

## 2016-05-03 NOTE — Telephone Encounter (Signed)
Left a detailed message for Adam Salas on her VM.

## 2016-06-13 DIAGNOSIS — E039 Hypothyroidism, unspecified: Secondary | ICD-10-CM | POA: Diagnosis not present

## 2016-06-13 DIAGNOSIS — E785 Hyperlipidemia, unspecified: Secondary | ICD-10-CM | POA: Diagnosis not present

## 2016-06-23 DIAGNOSIS — N4889 Other specified disorders of penis: Secondary | ICD-10-CM | POA: Diagnosis not present

## 2016-06-29 DIAGNOSIS — I4891 Unspecified atrial fibrillation: Secondary | ICD-10-CM | POA: Diagnosis not present

## 2016-06-29 DIAGNOSIS — I10 Essential (primary) hypertension: Secondary | ICD-10-CM | POA: Diagnosis not present

## 2016-06-29 DIAGNOSIS — F0150 Vascular dementia without behavioral disturbance: Secondary | ICD-10-CM

## 2016-06-29 DIAGNOSIS — K219 Gastro-esophageal reflux disease without esophagitis: Secondary | ICD-10-CM | POA: Diagnosis not present

## 2016-06-29 DIAGNOSIS — E785 Hyperlipidemia, unspecified: Secondary | ICD-10-CM | POA: Diagnosis not present

## 2016-08-25 DIAGNOSIS — K219 Gastro-esophageal reflux disease without esophagitis: Secondary | ICD-10-CM | POA: Diagnosis not present

## 2016-08-25 DIAGNOSIS — I10 Essential (primary) hypertension: Secondary | ICD-10-CM | POA: Diagnosis not present

## 2016-08-25 DIAGNOSIS — I480 Paroxysmal atrial fibrillation: Secondary | ICD-10-CM | POA: Diagnosis not present

## 2016-08-25 DIAGNOSIS — F0150 Vascular dementia without behavioral disturbance: Secondary | ICD-10-CM | POA: Diagnosis not present

## 2016-09-14 DIAGNOSIS — Z85828 Personal history of other malignant neoplasm of skin: Secondary | ICD-10-CM | POA: Diagnosis not present

## 2016-09-14 DIAGNOSIS — L821 Other seborrheic keratosis: Secondary | ICD-10-CM | POA: Diagnosis not present

## 2016-09-14 DIAGNOSIS — L578 Other skin changes due to chronic exposure to nonionizing radiation: Secondary | ICD-10-CM | POA: Diagnosis not present

## 2016-09-14 DIAGNOSIS — D485 Neoplasm of uncertain behavior of skin: Secondary | ICD-10-CM | POA: Diagnosis not present

## 2016-09-14 DIAGNOSIS — C4401 Basal cell carcinoma of skin of lip: Secondary | ICD-10-CM | POA: Diagnosis not present

## 2016-11-02 DIAGNOSIS — I10 Essential (primary) hypertension: Secondary | ICD-10-CM | POA: Diagnosis not present

## 2016-11-02 DIAGNOSIS — I4891 Unspecified atrial fibrillation: Secondary | ICD-10-CM | POA: Diagnosis not present

## 2016-11-02 DIAGNOSIS — E785 Hyperlipidemia, unspecified: Secondary | ICD-10-CM | POA: Diagnosis not present

## 2016-11-02 DIAGNOSIS — F0150 Vascular dementia without behavioral disturbance: Secondary | ICD-10-CM | POA: Diagnosis not present

## 2016-11-02 DIAGNOSIS — K219 Gastro-esophageal reflux disease without esophagitis: Secondary | ICD-10-CM | POA: Diagnosis not present

## 2016-11-10 DIAGNOSIS — L578 Other skin changes due to chronic exposure to nonionizing radiation: Secondary | ICD-10-CM | POA: Diagnosis not present

## 2016-11-10 DIAGNOSIS — Z85828 Personal history of other malignant neoplasm of skin: Secondary | ICD-10-CM | POA: Diagnosis not present

## 2016-11-10 DIAGNOSIS — D485 Neoplasm of uncertain behavior of skin: Secondary | ICD-10-CM | POA: Diagnosis not present

## 2016-12-29 DIAGNOSIS — I10 Essential (primary) hypertension: Secondary | ICD-10-CM | POA: Diagnosis not present

## 2016-12-29 DIAGNOSIS — I482 Chronic atrial fibrillation: Secondary | ICD-10-CM | POA: Diagnosis not present

## 2016-12-29 DIAGNOSIS — K219 Gastro-esophageal reflux disease without esophagitis: Secondary | ICD-10-CM | POA: Diagnosis not present

## 2016-12-29 DIAGNOSIS — F39 Unspecified mood [affective] disorder: Secondary | ICD-10-CM | POA: Diagnosis not present

## 2016-12-29 DIAGNOSIS — E785 Hyperlipidemia, unspecified: Secondary | ICD-10-CM | POA: Diagnosis not present

## 2017-03-01 DIAGNOSIS — K219 Gastro-esophageal reflux disease without esophagitis: Secondary | ICD-10-CM | POA: Diagnosis not present

## 2017-03-01 DIAGNOSIS — I10 Essential (primary) hypertension: Secondary | ICD-10-CM | POA: Diagnosis not present

## 2017-03-01 DIAGNOSIS — E785 Hyperlipidemia, unspecified: Secondary | ICD-10-CM | POA: Diagnosis not present

## 2017-03-01 DIAGNOSIS — I4891 Unspecified atrial fibrillation: Secondary | ICD-10-CM | POA: Diagnosis not present

## 2017-03-01 DIAGNOSIS — F0150 Vascular dementia without behavioral disturbance: Secondary | ICD-10-CM | POA: Diagnosis not present

## 2017-04-27 DIAGNOSIS — I482 Chronic atrial fibrillation: Secondary | ICD-10-CM | POA: Diagnosis not present

## 2017-04-27 DIAGNOSIS — K219 Gastro-esophageal reflux disease without esophagitis: Secondary | ICD-10-CM | POA: Diagnosis not present

## 2017-04-27 DIAGNOSIS — F0150 Vascular dementia without behavioral disturbance: Secondary | ICD-10-CM | POA: Diagnosis not present

## 2017-04-27 DIAGNOSIS — I10 Essential (primary) hypertension: Secondary | ICD-10-CM | POA: Diagnosis not present

## 2017-06-22 DIAGNOSIS — E039 Hypothyroidism, unspecified: Secondary | ICD-10-CM | POA: Diagnosis not present

## 2017-06-22 DIAGNOSIS — E785 Hyperlipidemia, unspecified: Secondary | ICD-10-CM | POA: Diagnosis not present

## 2017-06-28 DIAGNOSIS — K219 Gastro-esophageal reflux disease without esophagitis: Secondary | ICD-10-CM | POA: Diagnosis not present

## 2017-06-28 DIAGNOSIS — F0150 Vascular dementia without behavioral disturbance: Secondary | ICD-10-CM | POA: Diagnosis not present

## 2017-06-28 DIAGNOSIS — I4891 Unspecified atrial fibrillation: Secondary | ICD-10-CM | POA: Diagnosis not present

## 2017-06-28 DIAGNOSIS — I10 Essential (primary) hypertension: Secondary | ICD-10-CM | POA: Diagnosis not present

## 2017-06-28 DIAGNOSIS — E785 Hyperlipidemia, unspecified: Secondary | ICD-10-CM | POA: Diagnosis not present

## 2017-08-16 DIAGNOSIS — I10 Essential (primary) hypertension: Secondary | ICD-10-CM | POA: Diagnosis not present

## 2017-08-16 DIAGNOSIS — F0150 Vascular dementia without behavioral disturbance: Secondary | ICD-10-CM | POA: Diagnosis not present

## 2017-08-16 DIAGNOSIS — K219 Gastro-esophageal reflux disease without esophagitis: Secondary | ICD-10-CM | POA: Diagnosis not present

## 2017-08-16 DIAGNOSIS — I480 Paroxysmal atrial fibrillation: Secondary | ICD-10-CM | POA: Diagnosis not present

## 2017-08-16 DIAGNOSIS — E785 Hyperlipidemia, unspecified: Secondary | ICD-10-CM | POA: Diagnosis not present

## 2017-10-27 DIAGNOSIS — E785 Hyperlipidemia, unspecified: Secondary | ICD-10-CM

## 2017-10-27 DIAGNOSIS — K219 Gastro-esophageal reflux disease without esophagitis: Secondary | ICD-10-CM

## 2017-10-27 DIAGNOSIS — I4891 Unspecified atrial fibrillation: Secondary | ICD-10-CM

## 2017-10-27 DIAGNOSIS — F0150 Vascular dementia without behavioral disturbance: Secondary | ICD-10-CM

## 2017-10-27 DIAGNOSIS — I10 Essential (primary) hypertension: Secondary | ICD-10-CM

## 2017-11-16 DIAGNOSIS — R278 Other lack of coordination: Secondary | ICD-10-CM | POA: Diagnosis not present

## 2017-11-16 DIAGNOSIS — M6281 Muscle weakness (generalized): Secondary | ICD-10-CM | POA: Diagnosis not present

## 2017-11-16 DIAGNOSIS — R2689 Other abnormalities of gait and mobility: Secondary | ICD-10-CM | POA: Diagnosis not present

## 2017-11-16 DIAGNOSIS — I4891 Unspecified atrial fibrillation: Secondary | ICD-10-CM | POA: Diagnosis not present

## 2017-11-17 DIAGNOSIS — B351 Tinea unguium: Secondary | ICD-10-CM | POA: Diagnosis not present

## 2017-11-20 DIAGNOSIS — I4891 Unspecified atrial fibrillation: Secondary | ICD-10-CM | POA: Diagnosis not present

## 2017-11-20 DIAGNOSIS — M6281 Muscle weakness (generalized): Secondary | ICD-10-CM | POA: Diagnosis not present

## 2017-11-20 DIAGNOSIS — R2689 Other abnormalities of gait and mobility: Secondary | ICD-10-CM | POA: Diagnosis not present

## 2017-11-20 DIAGNOSIS — R278 Other lack of coordination: Secondary | ICD-10-CM | POA: Diagnosis not present

## 2017-11-22 DIAGNOSIS — R2689 Other abnormalities of gait and mobility: Secondary | ICD-10-CM | POA: Diagnosis not present

## 2017-11-22 DIAGNOSIS — R278 Other lack of coordination: Secondary | ICD-10-CM | POA: Diagnosis not present

## 2017-11-22 DIAGNOSIS — I4891 Unspecified atrial fibrillation: Secondary | ICD-10-CM | POA: Diagnosis not present

## 2017-11-22 DIAGNOSIS — M6281 Muscle weakness (generalized): Secondary | ICD-10-CM | POA: Diagnosis not present

## 2017-11-25 DIAGNOSIS — M6281 Muscle weakness (generalized): Secondary | ICD-10-CM | POA: Diagnosis not present

## 2017-11-25 DIAGNOSIS — R2689 Other abnormalities of gait and mobility: Secondary | ICD-10-CM | POA: Diagnosis not present

## 2017-11-25 DIAGNOSIS — I4891 Unspecified atrial fibrillation: Secondary | ICD-10-CM | POA: Diagnosis not present

## 2017-11-25 DIAGNOSIS — R278 Other lack of coordination: Secondary | ICD-10-CM | POA: Diagnosis not present

## 2017-11-27 DIAGNOSIS — M6281 Muscle weakness (generalized): Secondary | ICD-10-CM | POA: Diagnosis not present

## 2017-11-27 DIAGNOSIS — R2689 Other abnormalities of gait and mobility: Secondary | ICD-10-CM | POA: Diagnosis not present

## 2017-11-27 DIAGNOSIS — R278 Other lack of coordination: Secondary | ICD-10-CM | POA: Diagnosis not present

## 2017-11-27 DIAGNOSIS — I4891 Unspecified atrial fibrillation: Secondary | ICD-10-CM | POA: Diagnosis not present

## 2017-11-28 DIAGNOSIS — M6281 Muscle weakness (generalized): Secondary | ICD-10-CM | POA: Diagnosis not present

## 2017-11-28 DIAGNOSIS — R278 Other lack of coordination: Secondary | ICD-10-CM | POA: Diagnosis not present

## 2017-11-28 DIAGNOSIS — R2689 Other abnormalities of gait and mobility: Secondary | ICD-10-CM | POA: Diagnosis not present

## 2017-11-28 DIAGNOSIS — I4891 Unspecified atrial fibrillation: Secondary | ICD-10-CM | POA: Diagnosis not present

## 2017-11-30 DIAGNOSIS — I4891 Unspecified atrial fibrillation: Secondary | ICD-10-CM | POA: Diagnosis not present

## 2017-11-30 DIAGNOSIS — R278 Other lack of coordination: Secondary | ICD-10-CM | POA: Diagnosis not present

## 2017-11-30 DIAGNOSIS — M6281 Muscle weakness (generalized): Secondary | ICD-10-CM | POA: Diagnosis not present

## 2017-11-30 DIAGNOSIS — R2689 Other abnormalities of gait and mobility: Secondary | ICD-10-CM | POA: Diagnosis not present

## 2017-12-05 DIAGNOSIS — I4891 Unspecified atrial fibrillation: Secondary | ICD-10-CM | POA: Diagnosis not present

## 2017-12-05 DIAGNOSIS — M6281 Muscle weakness (generalized): Secondary | ICD-10-CM | POA: Diagnosis not present

## 2017-12-05 DIAGNOSIS — R2689 Other abnormalities of gait and mobility: Secondary | ICD-10-CM | POA: Diagnosis not present

## 2017-12-05 DIAGNOSIS — R278 Other lack of coordination: Secondary | ICD-10-CM | POA: Diagnosis not present

## 2017-12-06 DIAGNOSIS — R2689 Other abnormalities of gait and mobility: Secondary | ICD-10-CM | POA: Diagnosis not present

## 2017-12-06 DIAGNOSIS — R278 Other lack of coordination: Secondary | ICD-10-CM | POA: Diagnosis not present

## 2017-12-06 DIAGNOSIS — I4891 Unspecified atrial fibrillation: Secondary | ICD-10-CM | POA: Diagnosis not present

## 2017-12-06 DIAGNOSIS — M6281 Muscle weakness (generalized): Secondary | ICD-10-CM | POA: Diagnosis not present

## 2017-12-08 DIAGNOSIS — I4891 Unspecified atrial fibrillation: Secondary | ICD-10-CM | POA: Diagnosis not present

## 2017-12-08 DIAGNOSIS — R278 Other lack of coordination: Secondary | ICD-10-CM | POA: Diagnosis not present

## 2017-12-08 DIAGNOSIS — R2689 Other abnormalities of gait and mobility: Secondary | ICD-10-CM | POA: Diagnosis not present

## 2017-12-08 DIAGNOSIS — M6281 Muscle weakness (generalized): Secondary | ICD-10-CM | POA: Diagnosis not present

## 2017-12-11 DIAGNOSIS — M6281 Muscle weakness (generalized): Secondary | ICD-10-CM | POA: Diagnosis not present

## 2017-12-11 DIAGNOSIS — I4891 Unspecified atrial fibrillation: Secondary | ICD-10-CM | POA: Diagnosis not present

## 2017-12-11 DIAGNOSIS — R2689 Other abnormalities of gait and mobility: Secondary | ICD-10-CM | POA: Diagnosis not present

## 2017-12-11 DIAGNOSIS — R278 Other lack of coordination: Secondary | ICD-10-CM | POA: Diagnosis not present

## 2017-12-12 DIAGNOSIS — I4891 Unspecified atrial fibrillation: Secondary | ICD-10-CM | POA: Diagnosis not present

## 2017-12-12 DIAGNOSIS — R278 Other lack of coordination: Secondary | ICD-10-CM | POA: Diagnosis not present

## 2017-12-12 DIAGNOSIS — R2689 Other abnormalities of gait and mobility: Secondary | ICD-10-CM | POA: Diagnosis not present

## 2017-12-12 DIAGNOSIS — M6281 Muscle weakness (generalized): Secondary | ICD-10-CM | POA: Diagnosis not present

## 2017-12-14 DIAGNOSIS — R278 Other lack of coordination: Secondary | ICD-10-CM | POA: Diagnosis not present

## 2017-12-14 DIAGNOSIS — R2689 Other abnormalities of gait and mobility: Secondary | ICD-10-CM | POA: Diagnosis not present

## 2017-12-14 DIAGNOSIS — M6281 Muscle weakness (generalized): Secondary | ICD-10-CM | POA: Diagnosis not present

## 2017-12-14 DIAGNOSIS — I4891 Unspecified atrial fibrillation: Secondary | ICD-10-CM | POA: Diagnosis not present

## 2017-12-18 DIAGNOSIS — N182 Chronic kidney disease, stage 2 (mild): Secondary | ICD-10-CM | POA: Diagnosis not present

## 2017-12-18 DIAGNOSIS — R2689 Other abnormalities of gait and mobility: Secondary | ICD-10-CM | POA: Diagnosis not present

## 2017-12-18 DIAGNOSIS — M6281 Muscle weakness (generalized): Secondary | ICD-10-CM | POA: Diagnosis not present

## 2017-12-18 DIAGNOSIS — I4891 Unspecified atrial fibrillation: Secondary | ICD-10-CM | POA: Diagnosis not present

## 2017-12-18 DIAGNOSIS — R278 Other lack of coordination: Secondary | ICD-10-CM | POA: Diagnosis not present

## 2017-12-19 DIAGNOSIS — F0150 Vascular dementia without behavioral disturbance: Secondary | ICD-10-CM | POA: Diagnosis not present

## 2017-12-19 DIAGNOSIS — I480 Paroxysmal atrial fibrillation: Secondary | ICD-10-CM | POA: Diagnosis not present

## 2017-12-19 DIAGNOSIS — E785 Hyperlipidemia, unspecified: Secondary | ICD-10-CM | POA: Diagnosis not present

## 2017-12-19 DIAGNOSIS — K219 Gastro-esophageal reflux disease without esophagitis: Secondary | ICD-10-CM | POA: Diagnosis not present

## 2017-12-20 DIAGNOSIS — R278 Other lack of coordination: Secondary | ICD-10-CM | POA: Diagnosis not present

## 2017-12-20 DIAGNOSIS — I4891 Unspecified atrial fibrillation: Secondary | ICD-10-CM | POA: Diagnosis not present

## 2017-12-20 DIAGNOSIS — R2689 Other abnormalities of gait and mobility: Secondary | ICD-10-CM | POA: Diagnosis not present

## 2017-12-20 DIAGNOSIS — M6281 Muscle weakness (generalized): Secondary | ICD-10-CM | POA: Diagnosis not present

## 2017-12-20 DIAGNOSIS — N182 Chronic kidney disease, stage 2 (mild): Secondary | ICD-10-CM | POA: Diagnosis not present

## 2017-12-22 DIAGNOSIS — I4891 Unspecified atrial fibrillation: Secondary | ICD-10-CM | POA: Diagnosis not present

## 2017-12-22 DIAGNOSIS — R2689 Other abnormalities of gait and mobility: Secondary | ICD-10-CM | POA: Diagnosis not present

## 2017-12-22 DIAGNOSIS — M6281 Muscle weakness (generalized): Secondary | ICD-10-CM | POA: Diagnosis not present

## 2017-12-22 DIAGNOSIS — N182 Chronic kidney disease, stage 2 (mild): Secondary | ICD-10-CM | POA: Diagnosis not present

## 2017-12-22 DIAGNOSIS — R278 Other lack of coordination: Secondary | ICD-10-CM | POA: Diagnosis not present

## 2017-12-25 DIAGNOSIS — R2689 Other abnormalities of gait and mobility: Secondary | ICD-10-CM | POA: Diagnosis not present

## 2017-12-25 DIAGNOSIS — M6281 Muscle weakness (generalized): Secondary | ICD-10-CM | POA: Diagnosis not present

## 2017-12-25 DIAGNOSIS — I4891 Unspecified atrial fibrillation: Secondary | ICD-10-CM | POA: Diagnosis not present

## 2017-12-25 DIAGNOSIS — R278 Other lack of coordination: Secondary | ICD-10-CM | POA: Diagnosis not present

## 2017-12-25 DIAGNOSIS — N182 Chronic kidney disease, stage 2 (mild): Secondary | ICD-10-CM | POA: Diagnosis not present

## 2017-12-27 DIAGNOSIS — R278 Other lack of coordination: Secondary | ICD-10-CM | POA: Diagnosis not present

## 2017-12-27 DIAGNOSIS — N182 Chronic kidney disease, stage 2 (mild): Secondary | ICD-10-CM | POA: Diagnosis not present

## 2017-12-27 DIAGNOSIS — M6281 Muscle weakness (generalized): Secondary | ICD-10-CM | POA: Diagnosis not present

## 2017-12-27 DIAGNOSIS — R2689 Other abnormalities of gait and mobility: Secondary | ICD-10-CM | POA: Diagnosis not present

## 2017-12-27 DIAGNOSIS — I4891 Unspecified atrial fibrillation: Secondary | ICD-10-CM | POA: Diagnosis not present

## 2018-03-02 DIAGNOSIS — I10 Essential (primary) hypertension: Secondary | ICD-10-CM | POA: Diagnosis not present

## 2018-03-02 DIAGNOSIS — F0150 Vascular dementia without behavioral disturbance: Secondary | ICD-10-CM | POA: Diagnosis not present

## 2018-03-02 DIAGNOSIS — I4891 Unspecified atrial fibrillation: Secondary | ICD-10-CM | POA: Diagnosis not present

## 2018-03-02 DIAGNOSIS — E785 Hyperlipidemia, unspecified: Secondary | ICD-10-CM | POA: Diagnosis not present

## 2018-03-02 DIAGNOSIS — K219 Gastro-esophageal reflux disease without esophagitis: Secondary | ICD-10-CM | POA: Diagnosis not present

## 2018-04-26 DIAGNOSIS — I10 Essential (primary) hypertension: Secondary | ICD-10-CM | POA: Diagnosis not present

## 2018-04-26 DIAGNOSIS — I480 Paroxysmal atrial fibrillation: Secondary | ICD-10-CM | POA: Diagnosis not present

## 2018-04-26 DIAGNOSIS — F0150 Vascular dementia without behavioral disturbance: Secondary | ICD-10-CM | POA: Diagnosis not present

## 2018-04-26 DIAGNOSIS — K219 Gastro-esophageal reflux disease without esophagitis: Secondary | ICD-10-CM | POA: Diagnosis not present

## 2018-07-02 DIAGNOSIS — D539 Nutritional anemia, unspecified: Secondary | ICD-10-CM | POA: Diagnosis not present

## 2018-07-02 DIAGNOSIS — E785 Hyperlipidemia, unspecified: Secondary | ICD-10-CM | POA: Diagnosis not present

## 2018-07-02 DIAGNOSIS — E039 Hypothyroidism, unspecified: Secondary | ICD-10-CM | POA: Diagnosis not present

## 2018-07-04 DIAGNOSIS — F0150 Vascular dementia without behavioral disturbance: Secondary | ICD-10-CM | POA: Diagnosis not present

## 2018-07-04 DIAGNOSIS — I4891 Unspecified atrial fibrillation: Secondary | ICD-10-CM | POA: Diagnosis not present

## 2018-07-04 DIAGNOSIS — K219 Gastro-esophageal reflux disease without esophagitis: Secondary | ICD-10-CM | POA: Diagnosis not present

## 2018-07-04 DIAGNOSIS — I10 Essential (primary) hypertension: Secondary | ICD-10-CM | POA: Diagnosis not present

## 2018-08-02 DIAGNOSIS — I4891 Unspecified atrial fibrillation: Secondary | ICD-10-CM | POA: Diagnosis not present

## 2018-08-28 DIAGNOSIS — D649 Anemia, unspecified: Secondary | ICD-10-CM

## 2018-08-28 DIAGNOSIS — N183 Chronic kidney disease, stage 3 (moderate): Secondary | ICD-10-CM | POA: Diagnosis not present

## 2018-08-28 DIAGNOSIS — F0150 Vascular dementia without behavioral disturbance: Secondary | ICD-10-CM | POA: Diagnosis not present

## 2018-08-28 DIAGNOSIS — I480 Paroxysmal atrial fibrillation: Secondary | ICD-10-CM | POA: Diagnosis not present

## 2018-08-28 DIAGNOSIS — K219 Gastro-esophageal reflux disease without esophagitis: Secondary | ICD-10-CM | POA: Diagnosis not present

## 2018-09-11 DIAGNOSIS — Z03818 Encounter for observation for suspected exposure to other biological agents ruled out: Secondary | ICD-10-CM | POA: Diagnosis not present

## 2018-10-19 DIAGNOSIS — Z03818 Encounter for observation for suspected exposure to other biological agents ruled out: Secondary | ICD-10-CM | POA: Diagnosis not present

## 2018-10-31 DIAGNOSIS — I4891 Unspecified atrial fibrillation: Secondary | ICD-10-CM | POA: Diagnosis not present

## 2018-10-31 DIAGNOSIS — F0150 Vascular dementia without behavioral disturbance: Secondary | ICD-10-CM | POA: Diagnosis not present

## 2018-10-31 DIAGNOSIS — N183 Chronic kidney disease, stage 3 (moderate): Secondary | ICD-10-CM | POA: Diagnosis not present

## 2018-10-31 DIAGNOSIS — D509 Iron deficiency anemia, unspecified: Secondary | ICD-10-CM

## 2018-10-31 DIAGNOSIS — K219 Gastro-esophageal reflux disease without esophagitis: Secondary | ICD-10-CM | POA: Diagnosis not present

## 2018-11-16 DIAGNOSIS — B351 Tinea unguium: Secondary | ICD-10-CM | POA: Diagnosis not present

## 2018-11-27 DIAGNOSIS — Z03818 Encounter for observation for suspected exposure to other biological agents ruled out: Secondary | ICD-10-CM | POA: Diagnosis not present

## 2018-11-30 DIAGNOSIS — Z03818 Encounter for observation for suspected exposure to other biological agents ruled out: Secondary | ICD-10-CM | POA: Diagnosis not present

## 2018-12-04 DIAGNOSIS — Z03818 Encounter for observation for suspected exposure to other biological agents ruled out: Secondary | ICD-10-CM | POA: Diagnosis not present

## 2018-12-10 DIAGNOSIS — N182 Chronic kidney disease, stage 2 (mild): Secondary | ICD-10-CM | POA: Diagnosis not present

## 2018-12-10 DIAGNOSIS — I129 Hypertensive chronic kidney disease with stage 1 through stage 4 chronic kidney disease, or unspecified chronic kidney disease: Secondary | ICD-10-CM | POA: Diagnosis not present

## 2018-12-10 DIAGNOSIS — F05 Delirium due to known physiological condition: Secondary | ICD-10-CM | POA: Diagnosis not present

## 2018-12-11 DIAGNOSIS — I639 Cerebral infarction, unspecified: Secondary | ICD-10-CM | POA: Diagnosis not present

## 2018-12-11 DIAGNOSIS — I129 Hypertensive chronic kidney disease with stage 1 through stage 4 chronic kidney disease, or unspecified chronic kidney disease: Secondary | ICD-10-CM | POA: Diagnosis not present

## 2018-12-11 DIAGNOSIS — R262 Difficulty in walking, not elsewhere classified: Secondary | ICD-10-CM | POA: Diagnosis not present

## 2018-12-11 DIAGNOSIS — R2681 Unsteadiness on feet: Secondary | ICD-10-CM | POA: Diagnosis not present

## 2018-12-11 DIAGNOSIS — N390 Urinary tract infection, site not specified: Secondary | ICD-10-CM | POA: Diagnosis not present

## 2018-12-12 DIAGNOSIS — R262 Difficulty in walking, not elsewhere classified: Secondary | ICD-10-CM | POA: Diagnosis not present

## 2018-12-12 DIAGNOSIS — R2681 Unsteadiness on feet: Secondary | ICD-10-CM | POA: Diagnosis not present

## 2018-12-12 DIAGNOSIS — I639 Cerebral infarction, unspecified: Secondary | ICD-10-CM | POA: Diagnosis not present

## 2018-12-12 DIAGNOSIS — I129 Hypertensive chronic kidney disease with stage 1 through stage 4 chronic kidney disease, or unspecified chronic kidney disease: Secondary | ICD-10-CM | POA: Diagnosis not present

## 2018-12-13 DIAGNOSIS — R2681 Unsteadiness on feet: Secondary | ICD-10-CM | POA: Diagnosis not present

## 2018-12-13 DIAGNOSIS — R262 Difficulty in walking, not elsewhere classified: Secondary | ICD-10-CM | POA: Diagnosis not present

## 2018-12-13 DIAGNOSIS — I129 Hypertensive chronic kidney disease with stage 1 through stage 4 chronic kidney disease, or unspecified chronic kidney disease: Secondary | ICD-10-CM | POA: Diagnosis not present

## 2018-12-13 DIAGNOSIS — I639 Cerebral infarction, unspecified: Secondary | ICD-10-CM | POA: Diagnosis not present

## 2018-12-14 DIAGNOSIS — Z03818 Encounter for observation for suspected exposure to other biological agents ruled out: Secondary | ICD-10-CM | POA: Diagnosis not present

## 2018-12-14 DIAGNOSIS — R2681 Unsteadiness on feet: Secondary | ICD-10-CM | POA: Diagnosis not present

## 2018-12-14 DIAGNOSIS — I129 Hypertensive chronic kidney disease with stage 1 through stage 4 chronic kidney disease, or unspecified chronic kidney disease: Secondary | ICD-10-CM | POA: Diagnosis not present

## 2018-12-14 DIAGNOSIS — R262 Difficulty in walking, not elsewhere classified: Secondary | ICD-10-CM | POA: Diagnosis not present

## 2018-12-14 DIAGNOSIS — I639 Cerebral infarction, unspecified: Secondary | ICD-10-CM | POA: Diagnosis not present

## 2018-12-17 DIAGNOSIS — R262 Difficulty in walking, not elsewhere classified: Secondary | ICD-10-CM | POA: Diagnosis not present

## 2018-12-17 DIAGNOSIS — R2681 Unsteadiness on feet: Secondary | ICD-10-CM | POA: Diagnosis not present

## 2018-12-17 DIAGNOSIS — I639 Cerebral infarction, unspecified: Secondary | ICD-10-CM | POA: Diagnosis not present

## 2018-12-17 DIAGNOSIS — I129 Hypertensive chronic kidney disease with stage 1 through stage 4 chronic kidney disease, or unspecified chronic kidney disease: Secondary | ICD-10-CM | POA: Diagnosis not present

## 2018-12-18 DIAGNOSIS — R262 Difficulty in walking, not elsewhere classified: Secondary | ICD-10-CM | POA: Diagnosis not present

## 2018-12-18 DIAGNOSIS — R2681 Unsteadiness on feet: Secondary | ICD-10-CM | POA: Diagnosis not present

## 2018-12-18 DIAGNOSIS — Z03818 Encounter for observation for suspected exposure to other biological agents ruled out: Secondary | ICD-10-CM | POA: Diagnosis not present

## 2018-12-18 DIAGNOSIS — I639 Cerebral infarction, unspecified: Secondary | ICD-10-CM | POA: Diagnosis not present

## 2018-12-18 DIAGNOSIS — I129 Hypertensive chronic kidney disease with stage 1 through stage 4 chronic kidney disease, or unspecified chronic kidney disease: Secondary | ICD-10-CM | POA: Diagnosis not present

## 2018-12-19 DIAGNOSIS — I639 Cerebral infarction, unspecified: Secondary | ICD-10-CM | POA: Diagnosis not present

## 2018-12-19 DIAGNOSIS — R2681 Unsteadiness on feet: Secondary | ICD-10-CM | POA: Diagnosis not present

## 2018-12-19 DIAGNOSIS — I129 Hypertensive chronic kidney disease with stage 1 through stage 4 chronic kidney disease, or unspecified chronic kidney disease: Secondary | ICD-10-CM | POA: Diagnosis not present

## 2018-12-19 DIAGNOSIS — R262 Difficulty in walking, not elsewhere classified: Secondary | ICD-10-CM | POA: Diagnosis not present

## 2018-12-20 DIAGNOSIS — I129 Hypertensive chronic kidney disease with stage 1 through stage 4 chronic kidney disease, or unspecified chronic kidney disease: Secondary | ICD-10-CM | POA: Diagnosis not present

## 2018-12-20 DIAGNOSIS — I639 Cerebral infarction, unspecified: Secondary | ICD-10-CM | POA: Diagnosis not present

## 2018-12-20 DIAGNOSIS — R262 Difficulty in walking, not elsewhere classified: Secondary | ICD-10-CM | POA: Diagnosis not present

## 2018-12-20 DIAGNOSIS — R2681 Unsteadiness on feet: Secondary | ICD-10-CM | POA: Diagnosis not present

## 2018-12-24 DIAGNOSIS — I639 Cerebral infarction, unspecified: Secondary | ICD-10-CM | POA: Diagnosis not present

## 2018-12-24 DIAGNOSIS — R2681 Unsteadiness on feet: Secondary | ICD-10-CM | POA: Diagnosis not present

## 2018-12-24 DIAGNOSIS — R262 Difficulty in walking, not elsewhere classified: Secondary | ICD-10-CM | POA: Diagnosis not present

## 2018-12-24 DIAGNOSIS — I129 Hypertensive chronic kidney disease with stage 1 through stage 4 chronic kidney disease, or unspecified chronic kidney disease: Secondary | ICD-10-CM | POA: Diagnosis not present

## 2018-12-25 DIAGNOSIS — R2681 Unsteadiness on feet: Secondary | ICD-10-CM | POA: Diagnosis not present

## 2018-12-25 DIAGNOSIS — R262 Difficulty in walking, not elsewhere classified: Secondary | ICD-10-CM | POA: Diagnosis not present

## 2018-12-25 DIAGNOSIS — I639 Cerebral infarction, unspecified: Secondary | ICD-10-CM | POA: Diagnosis not present

## 2018-12-25 DIAGNOSIS — I129 Hypertensive chronic kidney disease with stage 1 through stage 4 chronic kidney disease, or unspecified chronic kidney disease: Secondary | ICD-10-CM | POA: Diagnosis not present

## 2018-12-26 DIAGNOSIS — I129 Hypertensive chronic kidney disease with stage 1 through stage 4 chronic kidney disease, or unspecified chronic kidney disease: Secondary | ICD-10-CM | POA: Diagnosis not present

## 2018-12-26 DIAGNOSIS — I639 Cerebral infarction, unspecified: Secondary | ICD-10-CM | POA: Diagnosis not present

## 2018-12-26 DIAGNOSIS — R2681 Unsteadiness on feet: Secondary | ICD-10-CM | POA: Diagnosis not present

## 2018-12-26 DIAGNOSIS — R262 Difficulty in walking, not elsewhere classified: Secondary | ICD-10-CM | POA: Diagnosis not present

## 2018-12-27 DIAGNOSIS — E441 Mild protein-calorie malnutrition: Secondary | ICD-10-CM | POA: Diagnosis not present

## 2018-12-27 DIAGNOSIS — I639 Cerebral infarction, unspecified: Secondary | ICD-10-CM | POA: Diagnosis not present

## 2018-12-27 DIAGNOSIS — I480 Paroxysmal atrial fibrillation: Secondary | ICD-10-CM | POA: Diagnosis not present

## 2018-12-27 DIAGNOSIS — K219 Gastro-esophageal reflux disease without esophagitis: Secondary | ICD-10-CM | POA: Diagnosis not present

## 2018-12-27 DIAGNOSIS — I129 Hypertensive chronic kidney disease with stage 1 through stage 4 chronic kidney disease, or unspecified chronic kidney disease: Secondary | ICD-10-CM | POA: Diagnosis not present

## 2018-12-27 DIAGNOSIS — F0150 Vascular dementia without behavioral disturbance: Secondary | ICD-10-CM

## 2018-12-27 DIAGNOSIS — R262 Difficulty in walking, not elsewhere classified: Secondary | ICD-10-CM | POA: Diagnosis not present

## 2018-12-27 DIAGNOSIS — R2681 Unsteadiness on feet: Secondary | ICD-10-CM | POA: Diagnosis not present

## 2018-12-28 DIAGNOSIS — R262 Difficulty in walking, not elsewhere classified: Secondary | ICD-10-CM | POA: Diagnosis not present

## 2018-12-28 DIAGNOSIS — I639 Cerebral infarction, unspecified: Secondary | ICD-10-CM | POA: Diagnosis not present

## 2018-12-28 DIAGNOSIS — R2681 Unsteadiness on feet: Secondary | ICD-10-CM | POA: Diagnosis not present

## 2018-12-28 DIAGNOSIS — I129 Hypertensive chronic kidney disease with stage 1 through stage 4 chronic kidney disease, or unspecified chronic kidney disease: Secondary | ICD-10-CM | POA: Diagnosis not present

## 2018-12-31 DIAGNOSIS — I129 Hypertensive chronic kidney disease with stage 1 through stage 4 chronic kidney disease, or unspecified chronic kidney disease: Secondary | ICD-10-CM | POA: Diagnosis not present

## 2018-12-31 DIAGNOSIS — R2681 Unsteadiness on feet: Secondary | ICD-10-CM | POA: Diagnosis not present

## 2018-12-31 DIAGNOSIS — I639 Cerebral infarction, unspecified: Secondary | ICD-10-CM | POA: Diagnosis not present

## 2018-12-31 DIAGNOSIS — R262 Difficulty in walking, not elsewhere classified: Secondary | ICD-10-CM | POA: Diagnosis not present

## 2019-01-01 DIAGNOSIS — R2681 Unsteadiness on feet: Secondary | ICD-10-CM | POA: Diagnosis not present

## 2019-01-01 DIAGNOSIS — I129 Hypertensive chronic kidney disease with stage 1 through stage 4 chronic kidney disease, or unspecified chronic kidney disease: Secondary | ICD-10-CM | POA: Diagnosis not present

## 2019-01-01 DIAGNOSIS — I639 Cerebral infarction, unspecified: Secondary | ICD-10-CM | POA: Diagnosis not present

## 2019-01-01 DIAGNOSIS — R262 Difficulty in walking, not elsewhere classified: Secondary | ICD-10-CM | POA: Diagnosis not present

## 2019-01-02 DIAGNOSIS — I129 Hypertensive chronic kidney disease with stage 1 through stage 4 chronic kidney disease, or unspecified chronic kidney disease: Secondary | ICD-10-CM | POA: Diagnosis not present

## 2019-01-02 DIAGNOSIS — R2681 Unsteadiness on feet: Secondary | ICD-10-CM | POA: Diagnosis not present

## 2019-01-02 DIAGNOSIS — I639 Cerebral infarction, unspecified: Secondary | ICD-10-CM | POA: Diagnosis not present

## 2019-01-02 DIAGNOSIS — R262 Difficulty in walking, not elsewhere classified: Secondary | ICD-10-CM | POA: Diagnosis not present

## 2019-01-03 DIAGNOSIS — I129 Hypertensive chronic kidney disease with stage 1 through stage 4 chronic kidney disease, or unspecified chronic kidney disease: Secondary | ICD-10-CM | POA: Diagnosis not present

## 2019-01-03 DIAGNOSIS — R2681 Unsteadiness on feet: Secondary | ICD-10-CM | POA: Diagnosis not present

## 2019-01-03 DIAGNOSIS — R262 Difficulty in walking, not elsewhere classified: Secondary | ICD-10-CM | POA: Diagnosis not present

## 2019-01-03 DIAGNOSIS — I639 Cerebral infarction, unspecified: Secondary | ICD-10-CM | POA: Diagnosis not present

## 2019-01-04 DIAGNOSIS — I129 Hypertensive chronic kidney disease with stage 1 through stage 4 chronic kidney disease, or unspecified chronic kidney disease: Secondary | ICD-10-CM | POA: Diagnosis not present

## 2019-01-04 DIAGNOSIS — R2681 Unsteadiness on feet: Secondary | ICD-10-CM | POA: Diagnosis not present

## 2019-01-04 DIAGNOSIS — R262 Difficulty in walking, not elsewhere classified: Secondary | ICD-10-CM | POA: Diagnosis not present

## 2019-01-04 DIAGNOSIS — I639 Cerebral infarction, unspecified: Secondary | ICD-10-CM | POA: Diagnosis not present

## 2019-01-07 DIAGNOSIS — R262 Difficulty in walking, not elsewhere classified: Secondary | ICD-10-CM | POA: Diagnosis not present

## 2019-01-07 DIAGNOSIS — I639 Cerebral infarction, unspecified: Secondary | ICD-10-CM | POA: Diagnosis not present

## 2019-01-07 DIAGNOSIS — I129 Hypertensive chronic kidney disease with stage 1 through stage 4 chronic kidney disease, or unspecified chronic kidney disease: Secondary | ICD-10-CM | POA: Diagnosis not present

## 2019-01-07 DIAGNOSIS — D518 Other vitamin B12 deficiency anemias: Secondary | ICD-10-CM | POA: Diagnosis not present

## 2019-01-07 DIAGNOSIS — R2681 Unsteadiness on feet: Secondary | ICD-10-CM | POA: Diagnosis not present

## 2019-02-09 DIAGNOSIS — I639 Cerebral infarction, unspecified: Secondary | ICD-10-CM | POA: Diagnosis not present

## 2019-02-09 DIAGNOSIS — R262 Difficulty in walking, not elsewhere classified: Secondary | ICD-10-CM | POA: Diagnosis not present

## 2019-02-09 DIAGNOSIS — I129 Hypertensive chronic kidney disease with stage 1 through stage 4 chronic kidney disease, or unspecified chronic kidney disease: Secondary | ICD-10-CM | POA: Diagnosis not present

## 2019-02-09 DIAGNOSIS — R2681 Unsteadiness on feet: Secondary | ICD-10-CM | POA: Diagnosis not present

## 2019-02-12 DIAGNOSIS — R262 Difficulty in walking, not elsewhere classified: Secondary | ICD-10-CM | POA: Diagnosis not present

## 2019-02-12 DIAGNOSIS — R2681 Unsteadiness on feet: Secondary | ICD-10-CM | POA: Diagnosis not present

## 2019-02-12 DIAGNOSIS — I129 Hypertensive chronic kidney disease with stage 1 through stage 4 chronic kidney disease, or unspecified chronic kidney disease: Secondary | ICD-10-CM | POA: Diagnosis not present

## 2019-02-12 DIAGNOSIS — I639 Cerebral infarction, unspecified: Secondary | ICD-10-CM | POA: Diagnosis not present

## 2019-02-13 DIAGNOSIS — I639 Cerebral infarction, unspecified: Secondary | ICD-10-CM | POA: Diagnosis not present

## 2019-02-13 DIAGNOSIS — I129 Hypertensive chronic kidney disease with stage 1 through stage 4 chronic kidney disease, or unspecified chronic kidney disease: Secondary | ICD-10-CM | POA: Diagnosis not present

## 2019-02-13 DIAGNOSIS — R262 Difficulty in walking, not elsewhere classified: Secondary | ICD-10-CM | POA: Diagnosis not present

## 2019-02-13 DIAGNOSIS — R2681 Unsteadiness on feet: Secondary | ICD-10-CM | POA: Diagnosis not present

## 2019-02-14 DIAGNOSIS — R2681 Unsteadiness on feet: Secondary | ICD-10-CM | POA: Diagnosis not present

## 2019-02-14 DIAGNOSIS — I639 Cerebral infarction, unspecified: Secondary | ICD-10-CM | POA: Diagnosis not present

## 2019-02-14 DIAGNOSIS — I129 Hypertensive chronic kidney disease with stage 1 through stage 4 chronic kidney disease, or unspecified chronic kidney disease: Secondary | ICD-10-CM | POA: Diagnosis not present

## 2019-02-14 DIAGNOSIS — R262 Difficulty in walking, not elsewhere classified: Secondary | ICD-10-CM | POA: Diagnosis not present

## 2019-02-15 DIAGNOSIS — I639 Cerebral infarction, unspecified: Secondary | ICD-10-CM | POA: Diagnosis not present

## 2019-02-15 DIAGNOSIS — R262 Difficulty in walking, not elsewhere classified: Secondary | ICD-10-CM | POA: Diagnosis not present

## 2019-02-15 DIAGNOSIS — I129 Hypertensive chronic kidney disease with stage 1 through stage 4 chronic kidney disease, or unspecified chronic kidney disease: Secondary | ICD-10-CM | POA: Diagnosis not present

## 2019-02-15 DIAGNOSIS — R2681 Unsteadiness on feet: Secondary | ICD-10-CM | POA: Diagnosis not present

## 2019-02-18 DIAGNOSIS — I129 Hypertensive chronic kidney disease with stage 1 through stage 4 chronic kidney disease, or unspecified chronic kidney disease: Secondary | ICD-10-CM | POA: Diagnosis not present

## 2019-02-18 DIAGNOSIS — I639 Cerebral infarction, unspecified: Secondary | ICD-10-CM | POA: Diagnosis not present

## 2019-02-18 DIAGNOSIS — R262 Difficulty in walking, not elsewhere classified: Secondary | ICD-10-CM | POA: Diagnosis not present

## 2019-02-18 DIAGNOSIS — R2681 Unsteadiness on feet: Secondary | ICD-10-CM | POA: Diagnosis not present

## 2019-02-19 DIAGNOSIS — R2681 Unsteadiness on feet: Secondary | ICD-10-CM | POA: Diagnosis not present

## 2019-02-19 DIAGNOSIS — I129 Hypertensive chronic kidney disease with stage 1 through stage 4 chronic kidney disease, or unspecified chronic kidney disease: Secondary | ICD-10-CM | POA: Diagnosis not present

## 2019-02-19 DIAGNOSIS — I639 Cerebral infarction, unspecified: Secondary | ICD-10-CM | POA: Diagnosis not present

## 2019-02-19 DIAGNOSIS — R262 Difficulty in walking, not elsewhere classified: Secondary | ICD-10-CM | POA: Diagnosis not present

## 2019-02-20 DIAGNOSIS — R262 Difficulty in walking, not elsewhere classified: Secondary | ICD-10-CM | POA: Diagnosis not present

## 2019-02-20 DIAGNOSIS — R2681 Unsteadiness on feet: Secondary | ICD-10-CM | POA: Diagnosis not present

## 2019-02-20 DIAGNOSIS — I639 Cerebral infarction, unspecified: Secondary | ICD-10-CM | POA: Diagnosis not present

## 2019-02-20 DIAGNOSIS — I129 Hypertensive chronic kidney disease with stage 1 through stage 4 chronic kidney disease, or unspecified chronic kidney disease: Secondary | ICD-10-CM | POA: Diagnosis not present

## 2019-02-21 DIAGNOSIS — R262 Difficulty in walking, not elsewhere classified: Secondary | ICD-10-CM | POA: Diagnosis not present

## 2019-02-21 DIAGNOSIS — I129 Hypertensive chronic kidney disease with stage 1 through stage 4 chronic kidney disease, or unspecified chronic kidney disease: Secondary | ICD-10-CM | POA: Diagnosis not present

## 2019-02-21 DIAGNOSIS — R2681 Unsteadiness on feet: Secondary | ICD-10-CM | POA: Diagnosis not present

## 2019-02-21 DIAGNOSIS — I639 Cerebral infarction, unspecified: Secondary | ICD-10-CM | POA: Diagnosis not present

## 2019-02-22 DIAGNOSIS — R262 Difficulty in walking, not elsewhere classified: Secondary | ICD-10-CM | POA: Diagnosis not present

## 2019-02-22 DIAGNOSIS — I639 Cerebral infarction, unspecified: Secondary | ICD-10-CM | POA: Diagnosis not present

## 2019-02-22 DIAGNOSIS — I129 Hypertensive chronic kidney disease with stage 1 through stage 4 chronic kidney disease, or unspecified chronic kidney disease: Secondary | ICD-10-CM | POA: Diagnosis not present

## 2019-02-22 DIAGNOSIS — R2681 Unsteadiness on feet: Secondary | ICD-10-CM | POA: Diagnosis not present

## 2019-02-25 DIAGNOSIS — I129 Hypertensive chronic kidney disease with stage 1 through stage 4 chronic kidney disease, or unspecified chronic kidney disease: Secondary | ICD-10-CM | POA: Diagnosis not present

## 2019-02-25 DIAGNOSIS — R2681 Unsteadiness on feet: Secondary | ICD-10-CM | POA: Diagnosis not present

## 2019-02-25 DIAGNOSIS — R262 Difficulty in walking, not elsewhere classified: Secondary | ICD-10-CM | POA: Diagnosis not present

## 2019-02-25 DIAGNOSIS — I639 Cerebral infarction, unspecified: Secondary | ICD-10-CM | POA: Diagnosis not present

## 2019-02-26 DIAGNOSIS — R2681 Unsteadiness on feet: Secondary | ICD-10-CM | POA: Diagnosis not present

## 2019-02-26 DIAGNOSIS — I129 Hypertensive chronic kidney disease with stage 1 through stage 4 chronic kidney disease, or unspecified chronic kidney disease: Secondary | ICD-10-CM | POA: Diagnosis not present

## 2019-02-26 DIAGNOSIS — R262 Difficulty in walking, not elsewhere classified: Secondary | ICD-10-CM | POA: Diagnosis not present

## 2019-02-26 DIAGNOSIS — I639 Cerebral infarction, unspecified: Secondary | ICD-10-CM | POA: Diagnosis not present

## 2019-02-27 DIAGNOSIS — E43 Unspecified severe protein-calorie malnutrition: Secondary | ICD-10-CM | POA: Diagnosis not present

## 2019-02-27 DIAGNOSIS — N183 Chronic kidney disease, stage 3 unspecified: Secondary | ICD-10-CM | POA: Diagnosis not present

## 2019-02-27 DIAGNOSIS — I129 Hypertensive chronic kidney disease with stage 1 through stage 4 chronic kidney disease, or unspecified chronic kidney disease: Secondary | ICD-10-CM | POA: Diagnosis not present

## 2019-02-27 DIAGNOSIS — K219 Gastro-esophageal reflux disease without esophagitis: Secondary | ICD-10-CM | POA: Diagnosis not present

## 2019-02-27 DIAGNOSIS — F0150 Vascular dementia without behavioral disturbance: Secondary | ICD-10-CM | POA: Diagnosis not present

## 2019-02-27 DIAGNOSIS — I4891 Unspecified atrial fibrillation: Secondary | ICD-10-CM | POA: Diagnosis not present

## 2019-02-27 DIAGNOSIS — I639 Cerebral infarction, unspecified: Secondary | ICD-10-CM | POA: Diagnosis not present

## 2019-02-27 DIAGNOSIS — D649 Anemia, unspecified: Secondary | ICD-10-CM

## 2019-02-27 DIAGNOSIS — R262 Difficulty in walking, not elsewhere classified: Secondary | ICD-10-CM | POA: Diagnosis not present

## 2019-02-27 DIAGNOSIS — R2681 Unsteadiness on feet: Secondary | ICD-10-CM | POA: Diagnosis not present

## 2019-03-01 DIAGNOSIS — I129 Hypertensive chronic kidney disease with stage 1 through stage 4 chronic kidney disease, or unspecified chronic kidney disease: Secondary | ICD-10-CM | POA: Diagnosis not present

## 2019-03-01 DIAGNOSIS — R262 Difficulty in walking, not elsewhere classified: Secondary | ICD-10-CM | POA: Diagnosis not present

## 2019-03-01 DIAGNOSIS — I639 Cerebral infarction, unspecified: Secondary | ICD-10-CM | POA: Diagnosis not present

## 2019-03-01 DIAGNOSIS — R2681 Unsteadiness on feet: Secondary | ICD-10-CM | POA: Diagnosis not present

## 2019-03-04 DIAGNOSIS — D518 Other vitamin B12 deficiency anemias: Secondary | ICD-10-CM | POA: Diagnosis not present

## 2019-03-05 DIAGNOSIS — R262 Difficulty in walking, not elsewhere classified: Secondary | ICD-10-CM | POA: Diagnosis not present

## 2019-03-05 DIAGNOSIS — I129 Hypertensive chronic kidney disease with stage 1 through stage 4 chronic kidney disease, or unspecified chronic kidney disease: Secondary | ICD-10-CM | POA: Diagnosis not present

## 2019-03-05 DIAGNOSIS — R2681 Unsteadiness on feet: Secondary | ICD-10-CM | POA: Diagnosis not present

## 2019-03-05 DIAGNOSIS — I639 Cerebral infarction, unspecified: Secondary | ICD-10-CM | POA: Diagnosis not present

## 2019-03-06 DIAGNOSIS — R262 Difficulty in walking, not elsewhere classified: Secondary | ICD-10-CM | POA: Diagnosis not present

## 2019-03-06 DIAGNOSIS — R2681 Unsteadiness on feet: Secondary | ICD-10-CM | POA: Diagnosis not present

## 2019-03-06 DIAGNOSIS — I639 Cerebral infarction, unspecified: Secondary | ICD-10-CM | POA: Diagnosis not present

## 2019-03-06 DIAGNOSIS — I129 Hypertensive chronic kidney disease with stage 1 through stage 4 chronic kidney disease, or unspecified chronic kidney disease: Secondary | ICD-10-CM | POA: Diagnosis not present

## 2019-03-07 DIAGNOSIS — I129 Hypertensive chronic kidney disease with stage 1 through stage 4 chronic kidney disease, or unspecified chronic kidney disease: Secondary | ICD-10-CM | POA: Diagnosis not present

## 2019-03-07 DIAGNOSIS — I639 Cerebral infarction, unspecified: Secondary | ICD-10-CM | POA: Diagnosis not present

## 2019-03-07 DIAGNOSIS — R262 Difficulty in walking, not elsewhere classified: Secondary | ICD-10-CM | POA: Diagnosis not present

## 2019-03-07 DIAGNOSIS — R2681 Unsteadiness on feet: Secondary | ICD-10-CM | POA: Diagnosis not present

## 2019-03-08 DIAGNOSIS — R2681 Unsteadiness on feet: Secondary | ICD-10-CM | POA: Diagnosis not present

## 2019-03-08 DIAGNOSIS — R262 Difficulty in walking, not elsewhere classified: Secondary | ICD-10-CM | POA: Diagnosis not present

## 2019-03-08 DIAGNOSIS — I129 Hypertensive chronic kidney disease with stage 1 through stage 4 chronic kidney disease, or unspecified chronic kidney disease: Secondary | ICD-10-CM | POA: Diagnosis not present

## 2019-03-08 DIAGNOSIS — I639 Cerebral infarction, unspecified: Secondary | ICD-10-CM | POA: Diagnosis not present

## 2019-03-11 DIAGNOSIS — I129 Hypertensive chronic kidney disease with stage 1 through stage 4 chronic kidney disease, or unspecified chronic kidney disease: Secondary | ICD-10-CM | POA: Diagnosis not present

## 2019-03-11 DIAGNOSIS — R262 Difficulty in walking, not elsewhere classified: Secondary | ICD-10-CM | POA: Diagnosis not present

## 2019-03-11 DIAGNOSIS — I639 Cerebral infarction, unspecified: Secondary | ICD-10-CM | POA: Diagnosis not present

## 2019-03-11 DIAGNOSIS — R2681 Unsteadiness on feet: Secondary | ICD-10-CM | POA: Diagnosis not present

## 2019-03-12 DIAGNOSIS — R2681 Unsteadiness on feet: Secondary | ICD-10-CM | POA: Diagnosis not present

## 2019-03-12 DIAGNOSIS — R262 Difficulty in walking, not elsewhere classified: Secondary | ICD-10-CM | POA: Diagnosis not present

## 2019-03-12 DIAGNOSIS — I129 Hypertensive chronic kidney disease with stage 1 through stage 4 chronic kidney disease, or unspecified chronic kidney disease: Secondary | ICD-10-CM | POA: Diagnosis not present

## 2019-03-12 DIAGNOSIS — I639 Cerebral infarction, unspecified: Secondary | ICD-10-CM | POA: Diagnosis not present

## 2019-03-13 DIAGNOSIS — I129 Hypertensive chronic kidney disease with stage 1 through stage 4 chronic kidney disease, or unspecified chronic kidney disease: Secondary | ICD-10-CM | POA: Diagnosis not present

## 2019-03-13 DIAGNOSIS — R262 Difficulty in walking, not elsewhere classified: Secondary | ICD-10-CM | POA: Diagnosis not present

## 2019-03-13 DIAGNOSIS — I639 Cerebral infarction, unspecified: Secondary | ICD-10-CM | POA: Diagnosis not present

## 2019-03-13 DIAGNOSIS — R2681 Unsteadiness on feet: Secondary | ICD-10-CM | POA: Diagnosis not present

## 2019-03-14 DIAGNOSIS — R262 Difficulty in walking, not elsewhere classified: Secondary | ICD-10-CM | POA: Diagnosis not present

## 2019-03-14 DIAGNOSIS — I639 Cerebral infarction, unspecified: Secondary | ICD-10-CM | POA: Diagnosis not present

## 2019-03-14 DIAGNOSIS — R2681 Unsteadiness on feet: Secondary | ICD-10-CM | POA: Diagnosis not present

## 2019-03-14 DIAGNOSIS — I129 Hypertensive chronic kidney disease with stage 1 through stage 4 chronic kidney disease, or unspecified chronic kidney disease: Secondary | ICD-10-CM | POA: Diagnosis not present

## 2019-03-18 DIAGNOSIS — R262 Difficulty in walking, not elsewhere classified: Secondary | ICD-10-CM | POA: Diagnosis not present

## 2019-03-18 DIAGNOSIS — I639 Cerebral infarction, unspecified: Secondary | ICD-10-CM | POA: Diagnosis not present

## 2019-03-18 DIAGNOSIS — R2681 Unsteadiness on feet: Secondary | ICD-10-CM | POA: Diagnosis not present

## 2019-03-18 DIAGNOSIS — I129 Hypertensive chronic kidney disease with stage 1 through stage 4 chronic kidney disease, or unspecified chronic kidney disease: Secondary | ICD-10-CM | POA: Diagnosis not present

## 2019-03-19 DIAGNOSIS — I639 Cerebral infarction, unspecified: Secondary | ICD-10-CM | POA: Diagnosis not present

## 2019-03-19 DIAGNOSIS — I129 Hypertensive chronic kidney disease with stage 1 through stage 4 chronic kidney disease, or unspecified chronic kidney disease: Secondary | ICD-10-CM | POA: Diagnosis not present

## 2019-03-19 DIAGNOSIS — R2681 Unsteadiness on feet: Secondary | ICD-10-CM | POA: Diagnosis not present

## 2019-03-19 DIAGNOSIS — R262 Difficulty in walking, not elsewhere classified: Secondary | ICD-10-CM | POA: Diagnosis not present

## 2019-03-21 DIAGNOSIS — R262 Difficulty in walking, not elsewhere classified: Secondary | ICD-10-CM | POA: Diagnosis not present

## 2019-03-21 DIAGNOSIS — R2681 Unsteadiness on feet: Secondary | ICD-10-CM | POA: Diagnosis not present

## 2019-03-21 DIAGNOSIS — I129 Hypertensive chronic kidney disease with stage 1 through stage 4 chronic kidney disease, or unspecified chronic kidney disease: Secondary | ICD-10-CM | POA: Diagnosis not present

## 2019-03-21 DIAGNOSIS — I639 Cerebral infarction, unspecified: Secondary | ICD-10-CM | POA: Diagnosis not present

## 2019-03-25 DIAGNOSIS — I639 Cerebral infarction, unspecified: Secondary | ICD-10-CM | POA: Diagnosis not present

## 2019-03-25 DIAGNOSIS — I129 Hypertensive chronic kidney disease with stage 1 through stage 4 chronic kidney disease, or unspecified chronic kidney disease: Secondary | ICD-10-CM | POA: Diagnosis not present

## 2019-03-25 DIAGNOSIS — R2681 Unsteadiness on feet: Secondary | ICD-10-CM | POA: Diagnosis not present

## 2019-03-25 DIAGNOSIS — R262 Difficulty in walking, not elsewhere classified: Secondary | ICD-10-CM | POA: Diagnosis not present

## 2019-03-26 DIAGNOSIS — R2681 Unsteadiness on feet: Secondary | ICD-10-CM | POA: Diagnosis not present

## 2019-03-26 DIAGNOSIS — I129 Hypertensive chronic kidney disease with stage 1 through stage 4 chronic kidney disease, or unspecified chronic kidney disease: Secondary | ICD-10-CM | POA: Diagnosis not present

## 2019-03-26 DIAGNOSIS — R262 Difficulty in walking, not elsewhere classified: Secondary | ICD-10-CM | POA: Diagnosis not present

## 2019-03-26 DIAGNOSIS — I639 Cerebral infarction, unspecified: Secondary | ICD-10-CM | POA: Diagnosis not present

## 2019-03-29 DIAGNOSIS — I639 Cerebral infarction, unspecified: Secondary | ICD-10-CM | POA: Diagnosis not present

## 2019-03-29 DIAGNOSIS — R262 Difficulty in walking, not elsewhere classified: Secondary | ICD-10-CM | POA: Diagnosis not present

## 2019-03-29 DIAGNOSIS — I129 Hypertensive chronic kidney disease with stage 1 through stage 4 chronic kidney disease, or unspecified chronic kidney disease: Secondary | ICD-10-CM | POA: Diagnosis not present

## 2019-03-29 DIAGNOSIS — L219 Seborrheic dermatitis, unspecified: Secondary | ICD-10-CM | POA: Diagnosis not present

## 2019-03-29 DIAGNOSIS — R2681 Unsteadiness on feet: Secondary | ICD-10-CM | POA: Diagnosis not present

## 2019-04-01 DIAGNOSIS — F05 Delirium due to known physiological condition: Secondary | ICD-10-CM | POA: Diagnosis not present

## 2019-04-01 DIAGNOSIS — I639 Cerebral infarction, unspecified: Secondary | ICD-10-CM | POA: Diagnosis not present

## 2019-04-01 DIAGNOSIS — R262 Difficulty in walking, not elsewhere classified: Secondary | ICD-10-CM | POA: Diagnosis not present

## 2019-04-01 DIAGNOSIS — R2681 Unsteadiness on feet: Secondary | ICD-10-CM | POA: Diagnosis not present

## 2019-04-01 DIAGNOSIS — I129 Hypertensive chronic kidney disease with stage 1 through stage 4 chronic kidney disease, or unspecified chronic kidney disease: Secondary | ICD-10-CM | POA: Diagnosis not present

## 2019-04-15 DEATH — deceased
